# Patient Record
Sex: Female | Born: 2000 | Race: Black or African American | Hispanic: No | Marital: Single
Health system: Northeastern US, Academic
[De-identification: ages and names within clinical notes are randomized; demographics above are authoritative.]

## PROBLEM LIST (undated history)

## (undated) DIAGNOSIS — R4589 Other symptoms and signs involving emotional state: Secondary | ICD-10-CM

## (undated) DIAGNOSIS — F332 Major depressive disorder, recurrent severe without psychotic features: Secondary | ICD-10-CM

## (undated) DIAGNOSIS — R45851 Suicidal ideations: Secondary | ICD-10-CM

## (undated) DIAGNOSIS — G4700 Insomnia, unspecified: Secondary | ICD-10-CM

## (undated) DIAGNOSIS — F938 Other childhood emotional disorders: Secondary | ICD-10-CM

---
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
## 2018-01-02 LAB — URINE CULTURE: Culture: 60000 — AB

## 2018-01-02 LAB — GC/CHLAMYDIA PROBE AMP (~~LOC~~) NOT AT ARMC
CHLAMYDIA, DNA PROBE: NEGATIVE
NEISSERIA GONORRHEA: NEGATIVE

## 2018-04-12 ENCOUNTER — Encounter (HOSPITAL_COMMUNITY): Payer: Self-pay | Admitting: Emergency Medicine

## 2018-04-12 ENCOUNTER — Emergency Department (HOSPITAL_COMMUNITY)
Admission: EM | Admit: 2018-04-12 | Discharge: 2018-04-12 | Disposition: A | Discharge status: Home / Self Care | Payer: Managed Care, Other (non HMO) | Attending: Emergency Medicine | Admitting: Emergency Medicine

## 2018-04-12 DIAGNOSIS — Z5321 Procedure and treatment not carried out due to patient leaving prior to being seen by health care provider: Secondary | ICD-10-CM | POA: Diagnosis not present

## 2018-04-12 DIAGNOSIS — N898 Other specified noninflammatory disorders of vagina: Secondary | ICD-10-CM | POA: Diagnosis not present

## 2018-04-12 LAB — URINALYSIS, ROUTINE W REFLEX MICROSCOPIC
BILIRUBIN URINE: NEGATIVE
Bacteria, UA: NONE SEEN
GLUCOSE, UA: NEGATIVE mg/dL
KETONES UR: NEGATIVE mg/dL
Nitrite: NEGATIVE
PH: 5 (ref 5.0–8.0)
PROTEIN: NEGATIVE mg/dL
Specific Gravity, Urine: 1.029 (ref 1.005–1.030)

## 2018-04-12 LAB — PREGNANCY, URINE: Preg Test, Ur: NEGATIVE

## 2018-04-12 NOTE — ED Triage Notes (Signed)
Pt reports vaginal discomfort and vaginal discharge on/off X1 mo. Was seen at clinic and dx with yeast infection, STD test came back negative but continues to have pain and discomfort.

## 2018-04-12 NOTE — ED Notes (Signed)
Pt reports she is leaving and going to [facility] morning.

## 2018-04-14 LAB — URINE CULTURE

## 2018-08-29 ENCOUNTER — Other Ambulatory Visit: Payer: Self-pay | Admitting: Behavioral Health

## 2018-08-29 ENCOUNTER — Other Ambulatory Visit: Payer: Self-pay

## 2018-08-29 ENCOUNTER — Inpatient Hospital Stay (HOSPITAL_COMMUNITY): Admit: 2018-08-29 | Payer: Managed Care, Other (non HMO) | Admitting: Psychiatry

## 2018-08-29 ENCOUNTER — Inpatient Hospital Stay (HOSPITAL_COMMUNITY)
Admission: RE | Admit: 2018-08-29 | Discharge: 2018-09-01 | DRG: 881 | Disposition: A | Discharge status: Home / Self Care | Payer: Managed Care, Other (non HMO) | Attending: Psychiatry | Admitting: Psychiatry

## 2018-08-29 ENCOUNTER — Encounter (HOSPITAL_COMMUNITY): Payer: Self-pay

## 2018-08-29 DIAGNOSIS — F329 Major depressive disorder, single episode, unspecified: Secondary | ICD-10-CM | POA: Diagnosis present

## 2018-08-29 DIAGNOSIS — F333 Major depressive disorder, recurrent, severe with psychotic symptoms: Secondary | ICD-10-CM | POA: Diagnosis not present

## 2018-08-29 DIAGNOSIS — R45851 Suicidal ideations: Secondary | ICD-10-CM | POA: Diagnosis present

## 2018-08-29 DIAGNOSIS — Z1159 Encounter for screening for other viral diseases: Secondary | ICD-10-CM

## 2018-08-29 DIAGNOSIS — G4700 Insomnia, unspecified: Secondary | ICD-10-CM | POA: Diagnosis present

## 2018-08-29 LAB — SARS CORONAVIRUS 2 BY RT PCR (HOSPITAL ORDER, PERFORMED IN ~~LOC~~ HOSPITAL LAB): SARS Coronavirus 2: NEGATIVE

## 2018-08-29 MED ORDER — ACETAMINOPHEN 325 MG PO TABS
650.0000 mg | ORAL_TABLET | Freq: Four times a day (QID) | ORAL | Status: DC | PRN
  Administered 2018-09-01: 650 mg via ORAL
  Filled 2018-08-29: qty 2

## 2018-08-29 MED ORDER — ALUM & MAG HYDROXIDE-SIMETH 200-200-20 MG/5ML PO SUSP: 30.0000 mL | ORAL | Status: DC | PRN

## 2018-08-29 MED ORDER — HYDROXYZINE HCL 25 MG PO TABS
25.0000 mg | ORAL_TABLET | Freq: Three times a day (TID) | ORAL | Status: DC | PRN
  Administered 2018-08-30: 25 mg via ORAL
  Filled 2018-08-29 (×2): qty 1

## 2018-08-29 MED ORDER — TRAZODONE HCL 50 MG PO TABS
50.0000 mg | ORAL_TABLET | Freq: Every evening | ORAL | Status: DC | PRN
  Administered 2018-08-29 – 2018-08-31 (×3): 50 mg via ORAL
  Filled 2018-08-29 (×12): qty 1
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
## 2018-08-29 NOTE — Progress Notes (Signed)
Patient rated her day as a 6 out of a possible 10. The patient stated in group that she was not happy about being in the hospital. She also explained that she is dealing with a general lack of support. Her goal for tomorrow is to work on "stress management".

## 2018-08-29 NOTE — Progress Notes (Signed)
Covid swab collected per order 08/29/2018 at 1515.

## 2018-08-29 NOTE — Progress Notes (Signed)
Skin Assessment:  Patient has one tattoo on L wrist.  No skin problems.
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
## 2018-08-30 DIAGNOSIS — F333 Major depressive disorder, recurrent, severe with psychotic symptoms: Secondary | ICD-10-CM

## 2018-08-30 LAB — COMPREHENSIVE METABOLIC PANEL
ALT: 13 U/L (ref 0–44)
AST: 16 U/L (ref 15–41)
Albumin: 4.3 g/dL (ref 3.5–5.0)
Alkaline Phosphatase: 79 U/L (ref 38–126)
Anion gap: 7 (ref 5–15)
BUN: 9 mg/dL (ref 6–20)
CO2: 26 mmol/L (ref 22–32)
Calcium: 9.3 mg/dL (ref 8.9–10.3)
Chloride: 106 mmol/L (ref 98–111)
Creatinine, Ser: 0.73 mg/dL (ref 0.44–1.00)
GFR calc Af Amer: >60 mL/min (ref >60)
GFR calc non Af Amer: >60 mL/min (ref >60)
Glucose, Bld: 91 mg/dL (ref 70–99)
Potassium: 3.8 mmol/L (ref 3.5–5.1)
Sodium: 139 mmol/L (ref 135–145)
Total Bilirubin: 0.2 mg/dL — ABNORMAL LOW (ref 0.3–1.2)
Total Protein: 7 g/dL (ref 6.5–8.1)

## 2018-08-30 LAB — HEMOGLOBIN A1C
Hgb A1c MFr Bld: 5.4 % (ref 4.8–5.6)
Mean Plasma Glucose: 108.28 mg/dL

## 2018-08-30 LAB — LIPID PANEL
Cholesterol: 96 mg/dL (ref 0–200)
HDL: 58 mg/dL (ref >40)
LDL Cholesterol: 30 mg/dL (ref 0–99)
Total CHOL/HDL Ratio: 1.7 RATIO
Triglycerides: 42 mg/dL (ref <150)
VLDL: 8 mg/dL (ref 0–40)

## 2018-08-30 LAB — CBC
HCT: 38.3 % (ref 36.0–46.0)
Hemoglobin: 12.6 g/dL (ref 12.0–15.0)
MCH: 30.1 pg (ref 26.0–34.0)
MCHC: 32.9 g/dL (ref 30.0–36.0)
MCV: 91.6 fL (ref 80.0–100.0)
Platelets: 254 10*3/uL (ref 150–400)
RBC: 4.18 MIL/uL (ref 3.87–5.11)
RDW: 13.2 % (ref 11.5–15.5)
WBC: 5.7 10*3/uL (ref 4.0–10.5)
nRBC: 0 % (ref 0.0–0.2)

## 2018-08-30 LAB — ETHANOL: Alcohol, Ethyl (B): <10 mg/dL (ref <10)

## 2018-08-30 LAB — TSH: TSH: 2.748 u[IU]/mL (ref 0.350–4.500)

## 2018-08-30 MED ORDER — SERTRALINE HCL 25 MG PO TABS
25.0000 mg | ORAL_TABLET | Freq: Every day | ORAL | Status: DC
  Administered 2018-08-30 – 2018-09-01 (×3): 25 mg via ORAL
  Filled 2018-08-30 (×6): qty 1

## 2018-08-30 MED ORDER — ADULT MULTIVITAMIN W/MINERALS CH
1.0000 | ORAL_TABLET | Freq: Every day | ORAL | Status: DC
  Administered 2018-08-30 – 2018-09-01 (×3): 1 via ORAL
  Filled 2018-08-30 (×6): qty 1
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
## 2018-08-30 NOTE — Progress Notes (Signed)
Recreation Therapy Notes  Date:  7.1.20 Time: 0930 Location: [address] Dayroom  Group Topic: Stress Management  Goal Area(s) Addresses:  Patient will identify positive stress management techniques. Patient will identify benefits of using stress management post d/c.  Behavioral Response: Engaged  Intervention:  Stress Management  Activity :  Guided Imagery.  LRT introduced the stress management technique of guided imagery.  LRT read a script that lead patients on a journey on the beach.  Patients were to listen and follow along as LRT read script.  Education:  Stress Management, Discharge Planning.   Education Outcome: Acknowledges Education  Clinical Observations/Feedback:  Pt attended and participated in group.    Victorino Sparrow, LRT/CTRS         Ria Comment, Bernd Crom A 08/30/2018 11:10 AM

## 2018-08-30 NOTE — Progress Notes (Addendum)
Patient reports that her day was ok and she denies any thoughts of hurting herself or others.  She is calm and cooperative and has no complaints throughout the evening.  Sleep medication requested and order was obtained for Trazodone.  Medications administered as ordered.  Safety checks q 15 minutes. Emotional support provided.  Safety maintained on unit.    [place] NOVEL CORONAVIRUS (COVID-19) DAILY CHECK-OFF SYMPTOMS - answer yes or no to each - every day NO YES  Have you had a fever in the past 24 hours?  . Fever (Temp > 37.80C / 100F) X   Have you had any of these symptoms in the past 24 hours? . New Cough .  Sore Throat  .  Shortness of Breath .  Difficulty Breathing .  Unexplained Body Aches   X   Have you had any one of these symptoms in the past 24 hours not related to allergies?   . Runny Nose .  Nasal Congestion .  Sneezing   X   If you have had runny nose, nasal congestion, sneezing in the past 24 hours, has it worsened?  X   EXPOSURES - check yes or no X   Have you traveled outside the state in the past 14 days?  X   Have you been in contact with someone with a confirmed diagnosis of COVID-19 or PUI in the past 14 days without wearing appropriate PPE?  X   Have you been living in the same home as a person with confirmed diagnosis of COVID-19 or a PUI (household contact)?    X   Have you been diagnosed with COVID-19?    X              What to do next: Answered NO to all: Answered YES to anything:   Proceed with unit schedule Follow the [facility] Inpatient Flowsheet.

## 2018-08-30 NOTE — H&P (Signed)
Psychiatric Admission Assessment Adult  Patient Identification: Donna Velez MRN:  [id number] Date of Evaluation:  08/30/2018 Chief Complaint:  SI Principal Diagnosis: <principal problem not specified> Diagnosis:  Active Problems:   MDD (major depressive disorder)  History of Present Illness: Patient is seen and examined.  Patient is a 18 year old female with a probable past psychiatric history significant for major depression as well as posttraumatic stress disorder who presented to the behavioral health hospital as a walk-in on 08/29/2018.  She had been meeting with her counselor on the day of admission, and had disclosed that she was having suicidal thoughts.  The patient endorsed thoughts with a plan to overdose.  She also has cutting behaviors, and this seems to have increased recently.  She stated that she had had increased psychosocial stressors of just trying to cope with life as an independent adult.  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  She also stated that she is lost 30 pounds recently secondary to a poor appetite, and admitted to nightmares and flashbacks from her previous trauma.  She had a previous admission as an adolescent to the child psychiatry ward 1 to 2 years ago.  She had been placed on Lamictal as well as Lexapro at that time.  She stopped the Lamictal because she felt as though it numbed her emotions, but did continue on the Lexapro.  She admitted to also feeling quite anxious.  She was admitted to the hospital for evaluation and stabilization.  Associated Signs/Symptoms: Depression Symptoms:  depressed mood, anhedonia, insomnia, psychomotor retardation, fatigue, feelings of worthlessness/guilt, difficulty concentrating, hopelessness, suicidal thoughts without plan, anxiety, loss of energy/fatigue, disturbed sleep, weight loss, (Hypo) Manic  Symptoms:  Impulsivity, Irritable Mood, Labiality of Mood, Sexually Inapproprite Behavior, Anxiety Symptoms:  Excessive Worry, Psychotic Symptoms:  denied PTSD Symptoms: Had a traumatic exposure:  in the past Total Time spent with patient: 30 minutes  Past Psychiatric History: one previous hospitalization as an adolescent in 2018. % previous suicide attempts according to EMR  Is the patient at risk to self? Yes.    Has the patient been a risk to self in the past 6 months? Yes.    Has the patient been a risk to self within the distant past? Yes.    Is the patient a risk to others? No.  Has the patient been a risk to others in the past 6 months? No.  Has the patient been a risk to others within the distant past? No.   Prior Inpatient Therapy: Prior Inpatient Therapy: Yes Prior Therapy Dates: 09/2016 Prior Therapy Facilty/Provider(s): [facility] Reason for Treatment: Depression, SI Prior Outpatient Therapy: Prior Outpatient Therapy: Yes Prior Therapy Dates: ongoing Prior Therapy Facilty/Provider(s): [name] Reason for Treatment: Depression,stress Does patient have an ACCT team?: No Does patient have Intensive In-House Services?  : No Does patient have [facility] services? : No Does patient have P4CC services?: No  Alcohol Screening: 1. How often do you have a drink containing alcohol?: Monthly or less 2. How many drinks containing alcohol do you have on a typical day when you are drinking?: 1 or 2 3. How often do you have six or more drinks on one occasion?: Never AUDIT-C Score: 1 4. How often during the last year have you found that you were not able to stop drinking once you had started?: Never 5.  How often during the last year have you failed to do what was normally expected from you becasue of drinking?: Never 6. How often during the last year have you needed a first drink in the morning to get yourself going after a heavy drinking session?: Never 7. How often  during the last year have you had a feeling of guilt of remorse after drinking?: Never 8. How often during the last year have you been unable to remember what happened the night before because you had been drinking?: Never 9. Have you or someone else been injured as a result of your drinking?: No 10. Has a relative or friend or a doctor or another health worker been concerned about your drinking or suggested you cut down?: No Alcohol Use Disorder Identification Test Final Score (AUDIT): 1 Substance Abuse History in the last 12 months:  Yes.   Consequences of Substance Abuse: Negative Previous Psychotropic Medications: Yes  Psychological Evaluations: Yes  Past Medical History:  Past Medical History:  Diagnosis Date  . 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 At risk for self harm 10/19/2016  . Insomnia 10/19/2016  . MDD (major depressive disorder), recurrent episode, severe (HCC) 10/19/2016  . Suicidal ideation 10/19/2016   History reviewed. No pertinent surgical history. Family History: History reviewed. No pertinent family history. Family Psychiatric  History: Patient is adopted but stated a history of addiction and alcohol use on both sides of biological family Tobacco Screening:   Social History:  Social History   Substance and Sexual Activity  Alcohol Use No     Social History   Substance and Sexual Activity  Drug Use Yes  . Types: Marijuana    Additional Social History: Marital status: Single    Pain Medications: none reported Prescriptions: stopped meds Over the Counter: unknown History of alcohol / drug use?: Yes Name of Substance 1: THC 1 - Frequency: couple times daily                  Allergies:   Allergies  Allergen Reactions  . Amoxicillin Hives         Lab Results:  Results for orders placed or performed during the hospital encounter of 08/29/18 (from the past 48 hour(s))  SARS Coronavirus 2 (CEPHEID - Performed in [facility] hospital lab),  Hosp Order     Status: None   Collection Time: 08/29/18  2:51 PM   Specimen: Nasopharyngeal Swab  Result Value Ref Range   SARS Coronavirus 2 NEGATIVE NEGATIVE    Comment: (NOTE) If result is NEGATIVE SARS-CoV-2 target nucleic acids are NOT DETECTED. The SARS-CoV-2 RNA is generally detectable in upper and lower  respiratory specimens during the acute phase of infection. The lowest  concentration of SARS-CoV-2 viral copies this assay can detect is 250  copies / mL. A negative result does not preclude SARS-CoV-2 infection  and should not be used as the sole basis for treatment or other  patient management decisions.  A negative result may occur with  improper specimen collection / handling, submission of specimen other  than nasopharyngeal swab, presence of viral mutation(s) within the  areas targeted by this assay, and inadequate number of viral copies  (<250 copies / mL). A negative result must be combined with clinical  observations, patient history, and epidemiological information. If result is POSITIVE SARS-CoV-2 target nucleic acids are DETECTED. The SARS-CoV-2 RNA is generally detectable in upper and lower  respiratory specimens dur ing the acute phase of infection.  Positive  results are indicative of active infection with SARS-CoV-2.  Clinical  correlation with patient history and other diagnostic information is  necessary to determine patient infection status.  Positive results do  not rule out bacterial infection or co-infection with other viruses. If result is PRESUMPTIVE POSTIVE SARS-CoV-2 nucleic acids MAY BE PRESENT.   A presumptive positive result was obtained on the submitted specimen  and confirmed on repeat testing.  While 2019 novel coronavirus  (SARS-CoV-2) nucleic acids may be present in the submitted sample  additional confirmatory testing may be necessary for epidemiological  and / or clinical management purposes  to differentiate between  SARS-CoV-2 and other  Sarbecovirus currently known to infect humans.  If clinically indicated additional testing with an alternate test  methodology [id number]) is advised. The SARS-CoV-2 RNA is generally  detectable in upper and lower respiratory sp ecimens during the acute  phase of infection. The expected result is Negative. Fact Sheet for Patients:  StrictlyIdeas.no Fact Sheet for Healthcare Providers: BankingDealers.co.za This test is not yet approved or cleared by the [place] FDA and has been authorized for detection and/or diagnosis of SARS-CoV-2 by FDA under an Emergency Use Authorization (EUA).  This EUA will remain in effect (meaning this test can be used) for the duration of the COVID-19 declaration under Section 564(b)(1) of the Act, 21 U.S.C. section 360bbb-3(b)(1), unless the authorization is terminated or revoked sooner. Performed at [facility], [place] [address], [place], [place] [address]   CBC     Status: None   Collection Time: 08/30/18  6:45 AM  Result Value Ref Range   WBC 5.7 4.0 - 10.5 K/uL   RBC 4.18 3.87 - 5.11 MIL/uL   Hemoglobin 12.6 12.0 - 15.0 g/dL   HCT 38.3 36.0 - 46.0 %   MCV 91.6 80.0 - 100.0 fL   MCH 30.1 26.0 - 34.0 pg   MCHC 32.9 30.0 - 36.0 g/dL   RDW 13.2 11.5 - 15.5 %   Platelets 254 150 - 400 K/uL   nRBC 0.0 0.0 - 0.2 %    Comment: Performed at [facility], [place] [address], [place], [place] [address]  Comprehensive metabolic panel     Status: Abnormal   Collection Time: 08/30/18  6:45 AM  Result Value Ref Range   Sodium 139 135 - 145 mmol/L   Potassium 3.8 3.5 - 5.1 mmol/L   Chloride 106 98 - 111 mmol/L   CO2 26 22 - 32 mmol/L   Glucose, Bld 91 70 - 99 mg/dL   BUN 9 6 - 20 mg/dL   Creatinine, Ser 0.73 0.44 - 1.00 mg/dL   Calcium 9.3 8.9 - 10.3 mg/dL   Total Protein 7.0 6.5 - 8.1 g/dL   Albumin 4.3 3.5 - 5.0 g/dL   AST 16 15 - 41 U/L   ALT 13 0 - 44 U/L   Alkaline  Phosphatase 79 38 - 126 U/L   Total Bilirubin 0.2 (L) 0.3 - 1.2 mg/dL   GFR calc non Af Amer >60 >60 mL/min   GFR calc Af Amer >60 >60 mL/min   Anion gap 7 5 - 15    Comment: Performed at [facility], [place] [address] [place], [place] [address]  Ethanol     Status: None   Collection Time: 08/30/18  6:45 AM  Result Value Ref Range   Alcohol, Ethyl (B) <10 <10 mg/dL    Comment: (NOTE) Lowest detectable limit for serum alcohol is 10  mg/dL. For medical purposes only. Performed at [facility], [address] [place], [place] [address]   Hemoglobin A1c     Status: None   Collection Time: 08/30/18  6:45 AM  Result Value Ref Range   Hgb A1c MFr Bld 5.4 4.8 - 5.6 %    Comment: (NOTE) Pre diabetes:          5.7%-6.4% Diabetes:              >6.4% Glycemic control for   <7.0% adults with diabetes    Mean Plasma Glucose 108.28 mg/dL    Comment: Performed at [facility], [address] [place], [place] [address]  Lipid panel     Status: None   Collection Time: 08/30/18  6:45 AM  Result Value Ref Range   Cholesterol 96 0 - 200 mg/dL   Triglycerides 42 <865<150 mg/dL   HDL 58 >78>40 mg/dL   Total CHOL/HDL Ratio 1.7 RATIO   VLDL 8 0 - 40 mg/dL   LDL Cholesterol 30 0 - 99 mg/dL    Comment:        Total Cholesterol/HDL:CHD Risk Coronary Heart Disease Risk Table                     Men   Women  1/2 Average Risk   3.4   3.3  Average Risk       5.0   4.4  2 X Average Risk   9.6   7.1  3 X Average Risk  23.4   11.0        Use the calculated Patient Ratio above and the CHD Risk Table to determine the patient's CHD Risk.        ATP III CLASSIFICATION (LDL):  <100     mg/dL   Optimal  [id number]  mg/dL   Near or Above                    Optimal  130-159  mg/dL   Borderline  [id number]  mg/dL   High  >244>190     mg/dL   Very High Performed at [facility], [address] [place], [place] [address]   TSH     Status: None   Collection  Time: 08/30/18  6:45 AM  Result Value Ref Range   TSH 2.748 0.350 - 4.500 uIU/mL    Comment: Performed by a 3rd Generation assay with a functional sensitivity of <=0.01 uIU/mL. Performed at [facility], [address] [place], [place] [address]     Blood Alcohol level:  Lab Results  Component Value Date   ETH <10 08/30/2018    Metabolic Disorder Labs:  Lab Results  Component Value Date   HGBA1C 5.4 08/30/2018   MPG 108.28 08/30/2018   MPG 111.15 10/18/2016   No results found for: PROLACTIN Lab Results  Component Value Date   CHOL 96 08/30/2018   TRIG 42 08/30/2018   HDL 58 08/30/2018   CHOLHDL 1.7 08/30/2018   VLDL 8 08/30/2018   LDLCALC 30 08/30/2018    Current Medications: Current Facility-Administered Medications  Medication Dose Route Frequency Provider Last Rate Last Dose  . acetaminophen (TYLENOL) tablet 650 mg  650 mg Oral Q6H PRN Denzil Magnusonhomas, Lashunda, NP      . alum & mag hydroxide-simeth (MAALOX/MYLANTA) 200-200-20 MG/5ML suspension 30 mL  30 mL Oral Q4H PRN Denzil Magnusonhomas, Lashunda, NP      . hydrOXYzine (  ATARAX/VISTARIL) tablet 25 mg  25 mg Oral TID PRN Nira Conn A, NP      . multivitamin with minerals tablet 1 tablet  1 tablet Oral Daily Cobos, Rockey Situ, MD   1 tablet at 08/30/18 0927  . sertraline (ZOLOFT) tablet 25 mg  25 mg Oral Daily Antonieta Pert, MD   25 mg at 08/30/18 1032  . traZODone (DESYREL) tablet 50 mg  50 mg Oral QHS,MR X 1 Nira Conn A, NP   50 mg at 08/29/18 2200   PTA Medications: No medications prior to admission.    Musculoskeletal: Strength & Muscle Tone: within normal limits Gait & Station: normal Patient leans: N/A  Psychiatric Specialty Exam: Physical Exam  Nursing note and vitals reviewed. Constitutional: She is oriented to person, place, and time. She appears well-developed and well-nourished.  HENT:  Head: Normocephalic and atraumatic.  Respiratory: Effort normal.  Neurological: She is alert and  oriented to person, place, and time.    ROS  Blood pressure 120/74, pulse 61, temperature 98.4 F (36.9 C), temperature source Oral, resp. rate 16, height  (1.702 m), weight 68.9 kg, SpO2 100 %.Body mass index is 23.81 kg/m.  General Appearance: Casual  Eye Contact:  Fair  Speech:  Normal Rate  Volume:  Decreased  Mood:  Anxious and Depressed  Affect:  Congruent  Thought Process:  Coherent and Descriptions of Associations: Intact  Orientation:  Full (Time, Place, and Person)  Thought Content:  Logical  Suicidal Thoughts:  Yes.  without intent/plan  Homicidal Thoughts:  No  Memory:  Immediate;   Fair Recent;   Fair Remote;   Fair  Judgement:  Intact  Insight:  Fair  Psychomotor Activity:  Increased  Concentration:  Concentration: Fair and Attention Span: Fair  Recall:  [employer] of Knowledge:  Fair  Language:  Good  Akathisia:  Negative  Handed:  Right  AIMS (if indicated):     Assets:  Desire for Improvement Resilience  ADL's:  Intact  Cognition:  WNL  Sleep:       Treatment Plan Summary: Daily contact with patient to assess and evaluate symptoms and progress in treatment, Medication management and Plan : Patient is seen and examined.  Patient is a 18 year old female with a probable past psychiatric history significant for major depression as well as posttraumatic stress disorder.  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  She will be admitted to the hospital.  She will be integrated into the milieu.  She will be encouraged to attend groups.  She had previously been on Lamictal as well as Lexapro.  She was not really happy with the results.  These have been stopped in the past.  I am going to start her on Zoloft 25 mg p.o. daily and titrate that during the course the hospitalization.  Additionally we may want to add Catapres 1 mg p.o. nightly for nightmares and flashbacks, but she preferred to hold off on that initially.  She did endorse some  hypersexuality, but no euphoria, excessive spending or other symptoms consistent with bipolar disorder.  We will monitor for that.  She did admit to marijuana use, as well as some hallucinogens.  Review of her laboratories showed essentially normal chemistries, normal CBC, but no urine drug screen.  That is pending currently.  Hopefully we can get her headed in the right direction.  Observation Level/Precautions:  15 minute checks  Laboratory:  Chemistry Profile  Psychotherapy:    Medications:  Consultations:    Discharge Concerns:    Estimated LOS:  Other:     Physician Treatment Plan for Primary Diagnosis: <principal problem not specified> Long Term Goal(s): Improvement in symptoms so as ready for discharge  Short Term Goals: Ability to identify changes in lifestyle to reduce recurrence of condition will improve, Ability to verbalize feelings will improve, Ability to disclose and discuss suicidal ideas, Ability to demonstrate self-control will improve, Ability to identify and develop effective coping behaviors will improve, Ability to maintain clinical measurements within normal limits will improve and Compliance with prescribed medications will improve  Physician Treatment Plan for Secondary Diagnosis: Active Problems:   MDD (major depressive disorder)  Long Term Goal(s): Improvement in symptoms so as ready for discharge  Short Term Goals: Ability to identify changes in lifestyle to reduce recurrence of condition will improve, Ability to verbalize feelings will improve, Ability to disclose and discuss suicidal ideas, Ability to demonstrate self-control will improve, Ability to identify and develop effective coping behaviors will improve, Ability to maintain clinical measurements within normal limits will improve and Compliance with prescribed medications will improve  I certify that inpatient services furnished can reasonably be expected to improve the patient's condition.    Antonieta PertGreg  Lawson Clary, MD 7/1/202012:59 PM

## 2018-08-30 NOTE — Progress Notes (Signed)
D:  Patient's self inventory sheet, patient has poor sleep, sleep medication helpful.  Poor appetite, low energy level, poor concentration.  Rated depression 8, hopeless 6, denied anxiety.  Denied withdrawals.  SI, contracts for safety.  Denied physical problems.  Physical pain, worst pain in past 24 hours is #5, abdomen, back.  Goal is being patient.  Plans to take medications.  No discharge plans. A:  Medications administered per MD orders.  Emotional support and encouragement given patient. R:  Denied SI and HI while talking to nurse today, contracts for safety.  Denied A/V hallucinations.  Safety maintained with 15 minute checks.

## 2018-08-30 NOTE — Progress Notes (Signed)
NUTRITION ASSESSMENT RD working remotely.   Pt identified as at risk on the Malnutrition Screen Tool  INTERVENTION: -will order daily multivitamin with minerals. -continue to encourage PO intakes.   NUTRITION DIAGNOSIS: Unintentional weight loss related to sub-optimal intake as evidenced by pt report.   Goal: Pt to meet >/= 90% of their estimated nutrition needs.  Monitor:  PO intake  Assessment:  Patient admitted for worsening depression, anxiety, worry, and attempted OD with ibuprofen. Limited weight history available. Current weight is 152 lb and weight on 12/17/16 was 161 lb and on 10/18/16 it was 152 lb.     18 y.o. female  Height: Ht Readings from Last 1 Encounters:  08/29/18 5\' 7"  (1.702 m) (86 %, Z= 1.06)*   * Growth percentiles are based on CDC (Girls, 2-20 Years) data.    Weight: Wt Readings from Last 1 Encounters:  08/29/18 68.9 kg (82 %, Z= 0.91)*   * Growth percentiles are based on CDC (Girls, 2-20 Years) data.    Weight Hx: Wt Readings from Last 10 Encounters:  08/29/18 68.9 kg (82 %, Z= 0.91)*  12/17/16 73.3 kg (90 %, Z= 1.30)*  10/18/16 68.8 kg (86 %, Z= 1.06)*   * Growth percentiles are based on CDC (Girls, 2-20 Years) data.    BMI:  Body mass index is 23.81 kg/m. Pt meets criteria for normal weight based on current BMI.  Estimated Nutritional Needs: Kcal: 25-30 kcal/kg Protein: > 1 gram protein/kg Fluid: 1 ml/kcal  Diet Order:  Diet Order            Diet regular Room service appropriate? No; Fluid consistency: Thin; Fluid restriction: 2000 mL Fluid  Diet effective now             Pt is also offered choice of unit snacks mid-morning and mid-afternoon.  Pt is eating as desired.   Lab results and medications reviewed.      Jarome Matin, MS, RD, LDN, [facility] Inpatient Clinical Dietitian Pager # [phone] After hours/weekend pager # [phone]

## 2018-08-31 LAB — RAPID URINE DRUG SCREEN, HOSP PERFORMED
Amphetamines: NOT DETECTED
Barbiturates: NOT DETECTED
Benzodiazepines: NOT DETECTED
Cocaine: NOT DETECTED
Opiates: NOT DETECTED
Tetrahydrocannabinol: POSITIVE — AB

## 2018-08-31 LAB — PREGNANCY, URINE: Preg Test, Ur: NEGATIVE

## 2018-08-31 MED ORDER — POLYETHYLENE GLYCOL 3350 17 G PO PACK
17.0000 g | PACK | Freq: Every day | ORAL | Status: DC
  Administered 2018-08-31 – 2018-09-01 (×2): 17 g via ORAL
  Filled 2018-08-31 (×4): qty 1

## 2018-08-31 MED ORDER — ENSURE ENLIVE PO LIQD
237.0000 mL | Freq: Two times a day (BID) | ORAL | Status: DC
  Administered 2018-08-31 – 2018-09-01 (×3): 237 mL via ORAL

## 2018-08-31 MED ORDER — DOCUSATE SODIUM 100 MG PO CAPS: 100.0000 mg | ORAL_CAPSULE | Freq: Every day | ORAL | Status: DC | PRN

## 2018-08-31 NOTE — Progress Notes (Signed)
DAR NOTE: Patient presents with anxious affect and irritable mood.  Denies suicidal thoughts, auditory and visual hallucinations.  Described energy level as low and concentration as good.  Patient observed yelling while using the phone.  Rates depression at 7, hopelessness at 6, and anxiety at 0.  Maintained on routine safety checks.  Medications given as prescribed.  Support and encouragement offered as needed.  Attended group and participated.  States goal for today is "not blowing up."  Patient remained in her room for majority of this shift.  Patient is safe on and off the unit.

## 2018-08-31 NOTE — Progress Notes (Signed)
[facility] MD Progress Note  08/31/2018 9:23 AM Donna Velez  MRN:  [id number] Subjective:  Patient is a 18 year old female with a probable past psychiatric history significant for major depression as well as posttraumatic stress disorder who presented to the behavioral health hospital as a walk-in on 08/29/2018. She had been meeting with her counselor on the day of admission, and had disclosed that she was having suicidal thoughts. The patient endorsed thoughts with a plan to overdose.  Objective: Patient is seen and examined.  Patient is a 18 year old female with the above-stated past psychiatric history who is seen in follow-up.  She stated she is feeling a bit better today.  She stated she got anxious last night, and talk with staff and was able to calm down.  She stated she has had decreases in her suicidal ideation.  She thought she has had a mild degree of progress.  We did discuss adding an Ensure to her diet given her weight loss, and also she is having some difficulties with constipation.  She denied any side effects to her current medications.  Review of her laboratories from 7/1 were all essentially normal except for the marijuana being present.  Her vital signs are stable, she is afebrile.  Her CIWA was only 1 this a.m.  She slept 6.75 hours last night.  Principal Problem: <principal problem not specified> Diagnosis: Active Problems:   MDD (major depressive disorder)  Total Time spent with patient: 15 minutes  Past Psychiatric History: See admission H&P  Past Medical History:  Past Medical History:  Diagnosis Date  . 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 At risk for self harm 10/19/2016  . Insomnia 10/19/2016  . MDD (major depressive disorder), recurrent episode, severe (HCC) 10/19/2016  . Suicidal ideation 10/19/2016   History reviewed. No pertinent surgical history. Family History: History reviewed. No pertinent family history. Family Psychiatric  History: See admission H&P Social  History:  Social History   Substance and Sexual Activity  Alcohol Use No     Social History   Substance and Sexual Activity  Drug Use Yes  . Types: Marijuana    Social History   Socioeconomic History  . Marital status: Single    Spouse name: Not on file  . Number of children: Not on file  . Years of education: Not on file  . Highest education level: Not on file  Occupational History  . Not on file  Social Needs  . Financial resource strain: Not on file  . Food insecurity    Worry: Not on file    Inability: Not on file  . Transportation needs    Medical: Not on file    Non-medical: Not on file  Tobacco Use  . Smoking status: Never Smoker  . Smokeless tobacco: Never Used  Substance and Sexual Activity  . Alcohol use: No  . Drug use: Yes    Types: Marijuana  . Sexual activity: Yes    Birth control/protection: Condom, I.U.D.  Lifestyle  . Physical activity    Days per week: Not on file    Minutes per session: Not on file  . Stress: Not on file  Relationships  . Social Musician on phone: Not on file    Gets together: Not on file    Attends religious service: Not on file    Active member of club or organization: Not on file    Attends meetings of clubs or organizations: Not on file  Relationship status: Not on file  Other Topics Concern  . Not on file  Social History Narrative  . Not on file   Additional Social History:    Pain Medications: none reported Prescriptions: stopped meds Over the Counter: unknown History of alcohol / drug use?: Yes Name of Substance 1: THC 1 - Frequency: couple times daily                  Sleep: Good  Appetite:  Fair  Current Medications: Current Facility-Administered Medications  Medication Dose Route Frequency Provider Last Rate Last Dose  . acetaminophen (TYLENOL) tablet 650 mg  650 mg Oral Q6H PRN Denzil Magnusonhomas, Lashunda, NP      . alum & mag hydroxide-simeth (MAALOX/MYLANTA) 200-200-20 MG/5ML  suspension 30 mL  30 mL Oral Q4H PRN Denzil Magnusonhomas, Lashunda, NP      . docusate sodium (COLACE) capsule 100 mg  100 mg Oral Daily PRN Antonieta Pertlary, Greg Lawson, MD      . feeding supplement (ENSURE ENLIVE) (ENSURE ENLIVE) liquid 237 mL  237 mL Oral BID BM Antonieta Pertlary, Greg Lawson, MD      . hydrOXYzine (ATARAX/VISTARIL) tablet 25 mg  25 mg Oral TID PRN Jackelyn PolingBerry, Jason A, NP   25 mg at 08/30/18 2212  . multivitamin with minerals tablet 1 tablet  1 tablet Oral Daily Cobos, Rockey SituFernando A, MD   1 tablet at 08/31/18 0816  . polyethylene glycol (MIRALAX / GLYCOLAX) packet 17 g  17 g Oral Daily Antonieta Pertlary, Greg Lawson, MD      . sertraline (ZOLOFT) tablet 25 mg  25 mg Oral Daily Antonieta Pertlary, Greg Lawson, MD   25 mg at 08/31/18 0816  . traZODone (DESYREL) tablet 50 mg  50 mg Oral QHS,MR X 1 Nira ConnBerry, Jason A, NP   50 mg at 08/30/18 2212    Lab Results:  Results for orders placed or performed during the hospital encounter of 08/29/18 (from the past 48 hour(s))  SARS Coronavirus 2 (CEPHEID - Performed in [facility] hospital lab), Hosp Order     Status: None   Collection Time: 08/29/18  2:51 PM   Specimen: Nasopharyngeal Swab  Result Value Ref Range   SARS Coronavirus 2 NEGATIVE NEGATIVE    Comment: (NOTE) If result is NEGATIVE SARS-CoV-2 target nucleic acids are NOT DETECTED. The SARS-CoV-2 RNA is generally detectable in upper and lower  respiratory specimens during the acute phase of infection. The lowest  concentration of SARS-CoV-2 viral copies this assay can detect is 250  copies / mL. A negative result does not preclude SARS-CoV-2 infection  and should not be used as the sole basis for treatment or other  patient management decisions.  A negative result may occur with  improper specimen collection / handling, submission of specimen other  than nasopharyngeal swab, presence of viral mutation(s) within the  areas targeted by this assay, and inadequate number of viral copies  (<250 copies / mL). A negative result must be combined  with clinical  observations, patient history, and epidemiological information. If result is POSITIVE SARS-CoV-2 target nucleic acids are DETECTED. The SARS-CoV-2 RNA is generally detectable in upper and lower  respiratory specimens dur ing the acute phase of infection.  Positive  results are indicative of active infection with SARS-CoV-2.  Clinical  correlation with patient history and other diagnostic information is  necessary to determine patient infection status.  Positive results do  not rule out bacterial infection or co-infection with other viruses. If result is PRESUMPTIVE POSTIVE SARS-CoV-2 nucleic  acids MAY BE PRESENT.   A presumptive positive result was obtained on the submitted specimen  and confirmed on repeat testing.  While 2019 novel coronavirus  (SARS-CoV-2) nucleic acids may be present in the submitted sample  additional confirmatory testing may be necessary for epidemiological  and / or clinical management purposes  to differentiate between  SARS-CoV-2 and other Sarbecovirus currently known to infect humans.  If clinically indicated additional testing with an alternate test  methodology [phone]) is advised. The SARS-CoV-2 RNA is generally  detectable in upper and lower respiratory sp ecimens during the acute  phase of infection. The expected result is Negative. Fact Sheet for Patients:  StrictlyIdeas.no Fact Sheet for Healthcare Providers: BankingDealers.co.za This test is not yet approved or cleared by the [place] FDA and has been authorized for detection and/or diagnosis of SARS-CoV-2 by FDA under an Emergency Use Authorization (EUA).  This EUA will remain in effect (meaning this test can be used) for the duration of the COVID-19 declaration under Section 564(b)(1) of the Act, 21 U.S.C. section 360bbb-3(b)(1), unless the authorization is terminated or revoked sooner. Performed at [facility], [place] [address] [place], [place] [address]   Pregnancy, urine     Status: None   Collection Time: 08/29/18  3:09 PM  Result Value Ref Range   Preg Test, Ur NEGATIVE NEGATIVE    Comment:        THE SENSITIVITY OF THIS METHODOLOGY IS >20 mIU/mL. Performed at [facility], [place] [address], [place], [place] [address]   Urine rapid drug screen (hosp performed)not at [facility]     Status: Abnormal   Collection Time: 08/29/18  3:09 PM  Result Value Ref Range   Opiates NONE DETECTED NONE DETECTED   Cocaine NONE DETECTED NONE DETECTED   Benzodiazepines NONE DETECTED NONE DETECTED   Amphetamines NONE DETECTED NONE DETECTED   Tetrahydrocannabinol POSITIVE (A) NONE DETECTED   Barbiturates NONE DETECTED NONE DETECTED    Comment: (NOTE) DRUG SCREEN FOR MEDICAL PURPOSES ONLY.  IF CONFIRMATION IS NEEDED FOR ANY PURPOSE, NOTIFY LAB WITHIN 5 DAYS. LOWEST DETECTABLE LIMITS FOR URINE DRUG SCREEN Drug Class                     Cutoff (ng/mL) Amphetamine and metabolites    1000 Barbiturate and metabolites    200 Benzodiazepine                 707 Tricyclics and metabolites     300 Opiates and metabolites        300 Cocaine and metabolites        300 THC                            50 Performed at [facility], [place] [address] [place], [place] [address]   CBC     Status: None   Collection Time: 08/30/18  6:45 AM  Result Value Ref Range   WBC 5.7 4.0 - 10.5 K/uL   RBC 4.18 3.87 - 5.11 MIL/uL   Hemoglobin 12.6 12.0 - 15.0 g/dL   HCT 38.3 36.0 - 46.0 %   MCV 91.6 80.0 - 100.0 fL   MCH 30.1 26.0 - 34.0 pg   MCHC 32.9 30.0 - 36.0 g/dL   RDW 13.2 11.5 - 15.5 %   Platelets 254 150 - 400 K/uL   nRBC 0.0 0.0 - 0.2 %  Comment: Performed at [facility], [address] [place], [place] [address]  Comprehensive metabolic panel     Status: Abnormal   Collection Time: 08/30/18  6:45 AM  Result Value Ref Range   Sodium 139 135 - 145  mmol/L   Potassium 3.8 3.5 - 5.1 mmol/L   Chloride 106 98 - 111 mmol/L   CO2 26 22 - 32 mmol/L   Glucose, Bld 91 70 - 99 mg/dL   BUN 9 6 - 20 mg/dL   Creatinine, Ser 6.040.73 0.44 - 1.00 mg/dL   Calcium 9.3 8.9 - 54.010.3 mg/dL   Total Protein 7.0 6.5 - 8.1 g/dL   Albumin 4.3 3.5 - 5.0 g/dL   AST 16 15 - 41 U/L   ALT 13 0 - 44 U/L   Alkaline Phosphatase 79 38 - 126 U/L   Total Bilirubin 0.2 (L) 0.3 - 1.2 mg/dL   GFR calc non Af Amer >60 >60 mL/min   GFR calc Af Amer >60 >60 mL/min   Anion gap 7 5 - 15    Comment: Performed at [facility], [address] [place], [place] [address]  Ethanol     Status: None   Collection Time: 08/30/18  6:45 AM  Result Value Ref Range   Alcohol, Ethyl (B) <10 <10 mg/dL    Comment: (NOTE) Lowest detectable limit for serum alcohol is 10 mg/dL. For medical purposes only. Performed at [facility], [address] [place], [place] [address]   Hemoglobin A1c     Status: None   Collection Time: 08/30/18  6:45 AM  Result Value Ref Range   Hgb A1c MFr Bld 5.4 4.8 - 5.6 %    Comment: (NOTE) Pre diabetes:          5.7%-6.4% Diabetes:              >6.4% Glycemic control for   <7.0% adults with diabetes    Mean Plasma Glucose 108.28 mg/dL    Comment: Performed at [facility], [address] [place], [place] [address]  Lipid panel     Status: None   Collection Time: 08/30/18  6:45 AM  Result Value Ref Range   Cholesterol 96 0 - 200 mg/dL   Triglycerides 42 <308<150 mg/dL   HDL 58 >65>40 mg/dL   Total CHOL/HDL Ratio 1.7 RATIO   VLDL 8 0 - 40 mg/dL   LDL Cholesterol 30 0 - 99 mg/dL    Comment:        Total Cholesterol/HDL:CHD Risk Coronary Heart Disease Risk Table                     Men   Women  1/2 Average Risk   3.4   3.3  Average Risk       5.0   4.4  2 X Average Risk   9.6   7.1  3 X Average Risk  23.4   11.0        Use the calculated Patient Ratio above and the CHD Risk Table to determine the patient's  CHD Risk.        ATP III CLASSIFICATION (LDL):  <100     mg/dL   Optimal  [id number]  mg/dL   Near or Above                    Optimal  130-159  mg/dL   Borderline  [id number]  mg/dL  High  >190     mg/dL   Very High Performed at [facility], [address] [place], [place] [address]   TSH     Status: None   Collection Time: 08/30/18  6:45 AM  Result Value Ref Range   TSH 2.748 0.350 - 4.500 uIU/mL    Comment: Performed by a 3rd Generation assay with a functional sensitivity of <=0.01 uIU/mL. Performed at [facility], [address] [place], [place] [address]     Blood Alcohol level:  Lab Results  Component Value Date   ETH <10 08/30/2018    Metabolic Disorder Labs: Lab Results  Component Value Date   HGBA1C 5.4 08/30/2018   MPG 108.28 08/30/2018   MPG 111.15 10/18/2016   No results found for: PROLACTIN Lab Results  Component Value Date   CHOL 96 08/30/2018   TRIG 42 08/30/2018   HDL 58 08/30/2018   CHOLHDL 1.7 08/30/2018   VLDL 8 08/30/2018   LDLCALC 30 08/30/2018    Physical Findings: AIMS: Facial and Oral Movements Muscles of Facial Expression: None, normal Lips and Perioral Area: None, normal Jaw: None, normal Tongue: None, normal,Extremity Movements Upper (arms, wrists, hands, fingers): None, normal Lower (legs, knees, ankles, toes): None, normal, Trunk Movements Neck, shoulders, hips: None, normal, Overall Severity Severity of abnormal movements (highest score from questions above): None, normal Incapacitation due to abnormal movements: None, normal Patient's awareness of abnormal movements (rate only patient's report): No Awareness, Dental Status Current problems with teeth and/or dentures?: No Does patient usually wear dentures?: No  CIWA:  CIWA-Ar Total: 1 COWS:  COWS Total Score: 1  Musculoskeletal: Strength & Muscle Tone: within normal limits Gait & Station: normal Patient leans: N/A  Psychiatric  Specialty Exam: Physical Exam  Nursing note and vitals reviewed. Constitutional: She is oriented to person, place, and time.  HENT:  Head: Normocephalic and atraumatic.  Respiratory: Effort normal.  Neurological: She is alert and oriented to person, place, and time.    ROS  Blood pressure 120/74, pulse 61, temperature 98.4 F (36.9 C), temperature source Oral, resp. rate 16, height  (1.702 m), weight 68.9 kg, SpO2 100 %.Body mass index is 23.81 kg/m.  General Appearance: Casual  Eye Contact:  Fair  Speech:  Normal Rate  Volume:  Decreased  Mood:  Depressed  Affect:  Congruent  Thought Process:  Coherent and Descriptions of Associations: Intact  Orientation:  Full (Time, Place, and Person)  Thought Content:  Logical  Suicidal Thoughts:  Yes.  without intent/plan  Homicidal Thoughts:  No  Memory:  Immediate;   Fair Recent;   Fair Remote;   Fair  Judgement:  Intact  Insight:  Fair  Psychomotor Activity:  Decreased  Concentration:  Concentration: Fair and Attention Span: Fair  Recall:  [employer] of Knowledge:  Good  Language:  Good  Akathisia:  Negative  Handed:  Right  AIMS (if indicated):     Assets:  Communication Skills Desire for Improvement Housing Resilience  ADL's:  Intact  Cognition:  WNL  Sleep:  Number of Hours: 6.75     Treatment Plan Summary: Daily contact with patient to assess and evaluate symptoms and progress in treatment, Medication management and Plan : Patient is seen and examined.  Patient is a 18 year old female with the above-stated past psychiatric history who is seen in follow-up.   Diagnosis: #1 major depression, recurrent, severe without psychotic features, #2 posttraumatic stress disorder  Patient stated that she  is feeling a bit better today.  She did sleep well.  She is complaining of some physical issues with regard to constipation and decreased appetite.  She continues on sertraline 25 mg p.o. daily as well as the hydroxyzine and  trazodone.  I am also adding MiraLAX today for constipation, also some Colace.  Otherwise we will continue her same treatment. 1.  Add Colace 100 mg p.o. daily as needed constipation. 2.  Add Ensure 2 times daily for nutritional supplementation. 3.  Continue hydroxyzine 25 mg p.o. 3 times daily as needed anxiety. 4.  Add MiraLAX 17 g of material in 8 ounces of water daily for constipation. 5.  Continue Zoloft 25 mg p.o. daily for depression and anxiety. 6.  Continue trazodone 50 mg p.o. nightly as needed insomnia. 7.  Disposition planning-in progress.  Antonieta PertGreg Lawson Clary, MD 08/31/2018, 9:23 AM
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
## 2018-08-31 NOTE — Progress Notes (Signed)
D: Pt denies SI/HI/AVH. Pt is pleasant and cooperative. Pt irritable about things that went on in her life and how she felt and responded to those things. Pt encouraged to embrace her feelings and work on controlling her actions and things that she says. Pt appeared receptive to information. Pt has good insight into Tx, and coping . But has limited experience dealing with various situations.   A: Pt was offered support and encouragement. Pt was given scheduled medications. Pt was encourage to attend groups. Q 15 minute checks were done for safety.   R:Pt attends groups and interacts well with peers and staff. Pt is taking medication. Pt has no complaints.Pt receptive to treatment and safety maintained on unit.

## 2018-08-31 NOTE — BHH Suicide Risk Assessment (Signed)
[place] INPATIENT:  Family/Significant Other Suicide Prevention Education  Suicide Prevention Education:  Patient Refusal for Family/Significant Other Suicide Prevention Education: The patient Donna Velez has refused to provide written consent for family/significant other to be provided Family/Significant Other Suicide Prevention Education during admission and/or prior to discharge.  Physician notified.  SPE completed with patient, as patient refused to consent to family contact. SPI pamphlet provided to pt and pt was encouraged to share information with support network, ask questions, and talk about any concerns relating to SPE. Patient denies access to guns/firearms and verbalized understanding of information provided. Mobile Crisis information also provided to patient.   Marylee Floras 08/31/2018, 3:07 PM

## 2018-09-01 MED ORDER — TRAZODONE HCL 50 MG PO TABS: 50.0000 mg | ORAL_TABLET | Freq: Every evening | ORAL | 0 refills | Status: AC | PRN

## 2018-09-01 MED ORDER — METRONIDAZOLE 500 MG PO TABS: 500.0000 mg | ORAL_TABLET | Freq: Two times a day (BID) | ORAL | 0 refills | Status: AC

## 2018-09-01 MED ORDER — SERTRALINE HCL 25 MG PO TABS: 25.0000 mg | ORAL_TABLET | Freq: Every day | ORAL | 0 refills | Status: AC

## 2018-09-01 MED ORDER — METRONIDAZOLE 500 MG PO TABS
500.0000 mg | ORAL_TABLET | Freq: Two times a day (BID) | ORAL | Status: DC
  Administered 2018-09-01: 500 mg via ORAL
  Filled 2018-09-01 (×5): qty 1

## 2018-09-01 NOTE — BHH Suicide Risk Assessment (Signed)
[facility] Discharge Suicide Risk Assessment   Principal Problem: <principal problem not specified> Discharge Diagnoses: Active Problems:   MDD (major depressive disorder)   Total Time spent with patient: 15 minutes  Musculoskeletal: Strength & Muscle Tone: within normal limits Gait & Station: normal Patient leans: N/A  Psychiatric Specialty Exam: Review of Systems  All other systems reviewed and are negative.   Blood pressure 102/68, pulse 77, temperature 98 F (36.7 C), temperature source Oral, resp. rate 20, height 5\' 7"  (1.702 m), weight 68.9 kg, SpO2 100 %.Body mass index is 23.81 kg/m.  General Appearance: Casual  Eye Contact::  Good  Speech:  Normal Rate409  Volume:  Normal  Mood:  Euthymic  Affect:  Congruent  Thought Process:  Coherent and Descriptions of Associations: Intact  Orientation:  Full (Time, Place, and Person)  Thought Content:  Logical  Suicidal Thoughts:  No  Homicidal Thoughts:  No  Memory:  Immediate;   Fair Recent;   Fair Remote;   Fair  Judgement:  Intact  Insight:  Good  Psychomotor Activity:  Normal  Concentration:  Good  Recall:  Good  Fund of Knowledge:Good  Language: Good  Akathisia:  Negative  Handed:  Right  AIMS (if indicated):     Assets:  Desire for Improvement Resilience  Sleep:  Number of Hours: 4.75  Cognition: WNL  ADL's:  Intact   Mental Status Per Nursing Assessment::   On Admission:  Suicidal ideation indicated by patient, Plan includes specific time, place, or method, Intention to act on suicide plan, Self-harm thoughts, Belief that plan would result in death, Intention to act on plan to harm others, Suicide plan, Self-harm behaviors  Demographic Factors:  Unemployed  Loss Factors: NA  Historical Factors: Impulsivity  Risk Reduction Factors:   Sense of responsibility to family and Positive social support  Continued Clinical Symptoms:  Depression:   Impulsivity  Cognitive Features That Contribute To Risk:  None     Suicide Risk:  Minimal: No identifiable suicidal ideation.  Patients presenting with no risk factors but with morbid ruminations; may be classified as minimal risk based on the severity of the depressive symptoms    Plan Of Care/Follow-up recommendations:  Activity:  ad lib  Sharma Covert, MD 09/01/2018, 10:45 AM

## 2018-09-01 NOTE — Progress Notes (Signed)
D: Pt denies SI/HI/AVH. Pt is pleasant and cooperative. Pt stated she was doing ok this evening. Pt visible on milieu A: Pt was offered support and encouragement. Pt was given scheduled medications. Pt was encourage to attend groups. Q 15 minute checks were done for safety.  R:Pt attends groups and interacts well with peers and staff. Pt is taking medication. Pt has no complaints.Pt receptive to treatment and safety maintained on unit.  Problem: Education: Goal: Emotional status will improve Outcome: Progressing   Problem: Education: Goal: Mental status will improve Outcome: Progressing

## 2018-09-01 NOTE — Progress Notes (Signed)
  [facility] Adult Case Management Discharge Plan :  Will you be returning to the same living situation after discharge:  Yes,  patient reports she is returning home  At discharge, do you have transportation home?: Yes,  patient reports her therapist is picking her up Do you have the ability to pay for your medications: Yes,  [employer]   Release of information consent forms completed and in the chart;  Patient's signature needed at discharge.  Patient to Follow up at: Follow-up Information    [facility] Follow up.   Specialty: Behavioral Health Why: Due to the holiday the office is closed. Please expect a call from the TEFL teacher on Monday with your medication management appointment.  Contact information: [place] [phone]          Next level of care provider has access to [place] and Suicide Prevention discussed: Yes,  with the patient      Has patient been referred to the Quitline?: N/A patient is not a smoker  Patient has been referred for addiction treatment: N/A  Marylee Floras, [place] 09/01/2018, 10:50 AM

## 2018-09-01 NOTE — Progress Notes (Signed)
[place] NOVEL CORONAVIRUS (COVID-19) DAILY CHECK-OFF SYMPTOMS - answer yes or no to each - every day NO YES  Have you had a fever in the past 24 hours?  . Fever (Temp > 37.80C / 100F) X   Have you had any of these symptoms in the past 24 hours? . New Cough .  Sore Throat  .  Shortness of Breath .  Difficulty Breathing .  Unexplained Body Aches   X   Have you had any one of these symptoms in the past 24 hours not related to allergies?   . Runny Nose .  Nasal Congestion .  Sneezing   X   If you have had runny nose, nasal congestion, sneezing in the past 24 hours, has it worsened?  X   EXPOSURES - check yes or no X   Have you traveled outside the state in the past 14 days?  X   Have you been in contact with someone with a confirmed diagnosis of COVID-19 or PUI in the past 14 days without wearing appropriate PPE?  X   Have you been living in the same home as a person with confirmed diagnosis of COVID-19 or a PUI (household contact)?    X   Have you been diagnosed with COVID-19?    X              What to do next: Answered NO to all: Answered YES to anything:   Proceed with unit schedule Follow the [facility] Inpatient Flowsheet.   

## 2018-09-01 NOTE — Discharge Summary (Signed)
Physician Discharge Summary Note  Patient:  Donna Velez is an 18 y.o., female MRN:  [id number] DOB:  2000/12/02 Patient phone:  [phone] (home)  Patient address:   [address] [place] [address],  Total Time spent with patient: 15 minutes  Date of Admission:  08/29/2018 Date of Discharge: 09/01/18  Reason for Admission:  Suicidal ideation  Principal Problem: <principal problem not specified> Discharge Diagnoses: Active Problems:   MDD (major depressive disorder)   Past Psychiatric History: One previous hospitalization in 2018 after suicide attempt.  Past Medical History:  Past Medical History:  Diagnosis Date  . 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 At risk for self harm 10/19/2016  . Insomnia 10/19/2016  . MDD (major depressive disorder), recurrent episode, severe ([place]) 10/19/2016  . Suicidal ideation 10/19/2016   History reviewed. No pertinent surgical history. Family History: History reviewed. No pertinent family history. Family Psychiatric  History: Patient is adopted but stated a history of addiction and alcohol use on both sides of biological family Social History:  Social History   Substance and Sexual Activity  Alcohol Use No     Social History   Substance and Sexual Activity  Drug Use Yes  . Types: Marijuana    Social History   Socioeconomic History  . Marital status: Single    Spouse name: Not on file  . Number of children: Not on file  . Years of education: Not on file  . Highest education level: Not on file  Occupational History  . Not on file  Social Needs  . Financial resource strain: Not on file  . Food insecurity    Worry: Not on file    Inability: Not on file  . Transportation needs    Medical: Not on file    Non-medical: Not on file  Tobacco Use  . Smoking status: Never Smoker  . Smokeless tobacco: Never Used  Substance and Sexual Activity  . Alcohol use: No  . Drug use: Yes    Types: Marijuana  . Sexual activity: Yes     Birth control/protection: Condom, I.U.D.  Lifestyle  . Physical activity    Days per week: Not on file    Minutes per session: Not on file  . Stress: Not on file  Relationships  . Social Herbalist on phone: Not on file    Gets together: Not on file    Attends religious service: Not on file    Active member of club or organization: Not on file    Attends meetings of clubs or organizations: Not on file    Relationship status: Not on file  Other Topics Concern  . Not on file  Social History Narrative  . Not on file    Hospital Course:  From admission H&P: Patient is a 17 year old female with a probable past psychiatric history significant for major depression as well as posttraumatic stress disorder who presented to the behavioral health hospital as a walk-in on 08/29/2018. She had been meeting with her counselor on the day of admission, and had disclosed that she was having suicidal thoughts. The patient endorsed thoughts with a plan to overdose. She also has cutting behaviors, and this seems to have increased recently. She stated that she had had increased psychosocial stressors of just trying to cope with life as an independent adult. 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 She also stated that she is lost 30 pounds recently secondary to a poor appetite, and admitted to nightmares and flashbacks from her previous trauma. She had a previous admission as an adolescent to the child psychiatry ward 1 to 2 years ago. She had been placed on Lamictal as well as Lexapro at that time. She stopped the Lamictal because she felt as though it numbed her emotions, but did continue on the Lexapro. She admitted to also feeling quite anxious. She was admitted to the hospital for evaluation and stabilization.  Ms. Donna Velez was admitted for depression with suicidal  ideation. She was started on Zoloft and trazodone. She participated in group therapy on the unit. She responded well to treatment with no adverse effects reported. [facility]. She is discharging on the medications listed below. She has shown improvement with improved mood, affect, sleep, appetite, and interaction. She denies any SI/HI/AVH and contracts for safety. [facility] (see below). Patient is provided with prescriptions for medications upon discharge. Her therapist is picking her up for discharge home.  Physical Findings: AIMS: Facial and Oral Movements Muscles of Facial Expression: None, normal Lips and Perioral Area: None, normal Jaw: None, normal Tongue: None, normal,Extremity Movements Upper (arms, wrists, hands, fingers): None, normal Lower (legs, knees, ankles, toes): None, normal, Trunk Movements Neck, shoulders, hips: None, normal, Overall Severity Severity of abnormal movements (highest score from questions above): None, normal Incapacitation due to abnormal movements: None, normal Patient's awareness of abnormal movements (rate only patient's report): No Awareness, Dental Status Current problems with teeth and/or dentures?: No Does patient usually wear dentures?: No  CIWA:  CIWA-Ar Total: 1 COWS:  COWS Total Score: 1  Musculoskeletal: Strength & Muscle Tone: within normal limits Gait & Station: normal Patient leans: N/A  Psychiatric Specialty Exam: Physical Exam  Nursing note and vitals reviewed. Constitutional: She is oriented to person, place, and time. She appears well-developed and well-nourished.  Cardiovascular: Normal rate.  Respiratory: Effort normal.  Neurological: She is alert and oriented to person, place, and time.    Review of Systems  Constitutional: Negative.   Psychiatric/Behavioral: Positive for depression (stable on medication) and substance  abuse (UDS +THC). Negative for hallucinations and suicidal ideas. The patient is not nervous/anxious and does not have insomnia.     Blood pressure 102/68, pulse 77, temperature 98 F (36.7 C), temperature source Oral, resp. rate 20, height 5\' 7"  (1.702 m), weight 68.9 kg, SpO2 100 %.Body mass index is 23.81 kg/m.  See MD's discharge SRA       Has this patient used any form of tobacco in the last 30 days? (Cigarettes, Smokeless Tobacco, Cigars, and/or Pipes)  No  Blood Alcohol level:  Lab Results  Component Value Date   ETH <10 08/30/2018    Metabolic Disorder Labs:  Lab Results  Component Value Date   HGBA1C 5.4 08/30/2018   MPG 108.28 08/30/2018   MPG 111.15 10/18/2016   No results found for: PROLACTIN Lab Results  Component Value Date   CHOL 96 08/30/2018   TRIG 42 08/30/2018   HDL 58 08/30/2018   CHOLHDL 1.7 08/30/2018   VLDL 8 08/30/2018   LDLCALC 30 08/30/2018    See Psychiatric Specialty Exam and Suicide Risk Assessment completed by Attending Physician prior to discharge.  Discharge destination:  Home  Is patient on multiple antipsychotic therapies at discharge:  No   Has Patient had three or more failed trials of antipsychotic monotherapy by history:  No  Recommended Plan for Multiple Antipsychotic Therapies: NA  Discharge Instructions    Discharge instructions   Complete by: As directed    Patient is instructed to take all prescribed medications as recommended. Report any side effects or adverse reactions to your outpatient psychiatrist. Patient is instructed to abstain from alcohol and illegal drugs while on prescription medications. In the event of worsening symptoms, patient is instructed to call the crisis hotline, 911, or go to the nearest emergency department for evaluation and treatment.     Allergies as of 09/01/2018      Reactions   Amoxicillin Hives         Medication List    TAKE these medications     Indication  metroNIDAZOLE 500 MG  tablet Commonly known as: FLAGYL Take 1 tablet (500 mg total) by mouth every 12 (twelve) hours.  Indication: Infection   sertraline 25 MG tablet Commonly known as: ZOLOFT Take 1 tablet (25 mg total) by mouth daily. Start taking on: September 02, 2018  Indication: Major Depressive Disorder   traZODone 50 MG tablet Commonly known as: DESYREL Take 1 tablet (50 mg total) by mouth at bedtime as needed for sleep.  Indication: Trouble Sleeping      Follow-up Information    [facility] Follow up.   Specialty: Behavioral Health Why: Due to the holiday the office is closed. Please expect a call from the Lawyerocial Worker Assistant on Monday with your medication management appointment.  Contact information: [address] [place] [id number] [phone]       [facility] Follow up.   Why: Please schedule a therapy appointment with your therapist Aishia Z. Griffin.   Contact information: [facility], 2nd Floor ph: [phone] fx: [phone]) [phone]          Follow-up recommendations: Activity as tolerated. Diet as recommended by primary care physician. Keep all scheduled follow-up appointments as recommended.   Comments:   Patient is instructed to take all prescribed medications as recommended. Report any side effects or adverse reactions to your outpatient psychiatrist. Patient is instructed to abstain from alcohol and illegal drugs while on prescription medications. In the event of worsening symptoms, patient is instructed to call the crisis hotline, 911, or go to the nearest emergency department for evaluation and treatment.  Signed: Aldean BakerJanet E Edda Orea, NP 09/01/2018, 10:57 AM

## 2018-09-01 NOTE — Progress Notes (Signed)
Patient ID: Donna Velez, female   DOB: 02-15-2001, 18 y.o.   MRN: [id number]  D: Pt alert and oriented on the unit.   A: Education, support, and encouragement provided. Discharge summary, medications and follow up appointments reviewed with pt. Suicide prevention resources provided, including "My 3 App." Pt's belongings in locker # 68 returned and belongings sheet signed.  R: Pt denies SI/HI, A/VH, pain, or any concerns at this time. Pt ambulatory on and off unit. Pt discharged to lobby.

## 2018-09-11 ENCOUNTER — Ambulatory Visit (HOSPITAL_COMMUNITY): Payer: Managed Care, Other (non HMO) | Admitting: Psychiatry

## 2018-09-11 ENCOUNTER — Other Ambulatory Visit: Payer: Self-pay

## 2018-12-26 ENCOUNTER — Other Ambulatory Visit: Payer: Self-pay

## 2018-12-26 ENCOUNTER — Encounter (HOSPITAL_COMMUNITY): Payer: Self-pay | Admitting: Emergency Medicine

## 2018-12-26 ENCOUNTER — Emergency Department (HOSPITAL_COMMUNITY): Payer: Managed Care, Other (non HMO)

## 2018-12-26 ENCOUNTER — Emergency Department (HOSPITAL_COMMUNITY)
Admission: EM | Admit: 2018-12-26 | Discharge: 2018-12-26 | Disposition: A | Discharge status: Home / Self Care | Payer: Managed Care, Other (non HMO) | Attending: Emergency Medicine | Admitting: Emergency Medicine

## 2018-12-26 DIAGNOSIS — M940 Chondrocostal junction syndrome [Tietze]: Secondary | ICD-10-CM | POA: Diagnosis not present

## 2018-12-26 DIAGNOSIS — R079 Chest pain, unspecified: Secondary | ICD-10-CM | POA: Diagnosis present

## 2018-12-26 LAB — CBC
HCT: 42.9 % (ref 36.0–46.0)
Hemoglobin: 14 g/dL (ref 12.0–15.0)
MCH: 29.5 pg (ref 26.0–34.0)
MCHC: 32.6 g/dL (ref 30.0–36.0)
MCV: 90.5 fL (ref 80.0–100.0)
Platelets: 259 10*3/uL (ref 150–400)
RBC: 4.74 MIL/uL (ref 3.87–5.11)
RDW: 12.6 % (ref 11.5–15.5)
WBC: 6.6 10*3/uL (ref 4.0–10.5)
nRBC: 0 % (ref 0.0–0.2)

## 2018-12-26 LAB — BASIC METABOLIC PANEL
Anion gap: 10 (ref 5–15)
BUN: 10 mg/dL (ref 6–20)
CO2: 24 mmol/L (ref 22–32)
Calcium: 9.9 mg/dL (ref 8.9–10.3)
Chloride: 106 mmol/L (ref 98–111)
Creatinine, Ser: 0.79 mg/dL (ref 0.44–1.00)
GFR calc Af Amer: >60 mL/min (ref >60)
GFR calc non Af Amer: >60 mL/min (ref >60)
Glucose, Bld: 101 mg/dL — ABNORMAL HIGH (ref 70–99)
Potassium: 3.2 mmol/L — ABNORMAL LOW (ref 3.5–5.1)
Sodium: 140 mmol/L (ref 135–145)

## 2018-12-26 LAB — I-STAT BETA HCG BLOOD, ED (NOT ORDERABLE): I-stat hCG, quantitative: <5 m[IU]/mL (ref <5)

## 2018-12-26 LAB — TROPONIN I (HIGH SENSITIVITY): Troponin I (High Sensitivity): <2 ng/L (ref <18)

## 2018-12-26 MED ORDER — SODIUM CHLORIDE 0.9% FLUSH: 3.0000 mL | Freq: Once | INTRAVENOUS | Status: DC

## 2018-12-26 MED ORDER — IBUPROFEN 600 MG PO TABS: 600.0000 mg | ORAL_TABLET | Freq: Three times a day (TID) | ORAL | 0 refills | Status: AC

## 2018-12-26 NOTE — ED Triage Notes (Signed)
Pt reports having chest pain for the last several weeks on right side of chest.

## 2018-12-26 NOTE — ED Provider Notes (Signed)
EKG Interpretation  Date/Time:  Tuesday December 26 2018 02:04:57 EDT Ventricular Rate:  78 PR Interval:    QRS Duration: 94 QT Interval:  383 QTC Calculation: 437 R Axis:   86 Text Interpretation: Sinus arrhythmia Early repolarization pattern No significant change since last tracing 22 Nov 2017 Confirmed by Rolland Porter [phone]) on 12/26/2018 2:31:07 AM         Rolland Porter, MD 12/26/18 0231

## 2018-12-26 NOTE — ED Provider Notes (Signed)
[place] DEPT Provider Note   CSN: [id number] Arrival date & time: 12/26/18  0121     History   Chief Complaint Chief Complaint  Patient presents with  . Chest Pain    HPI Donna Velez is a 18 y.o. female.     18yo female presents with complaint of pain to the front of her chest and back x several weeks. Patient thought at first her pain was due to anxiety but now is concerned she has costochondritis. Pain is similar to previous episodes of costochondritis. Denies SHOB, wheezing, cough, sick contacts, nausea, vomiting, changes in bowel or bladder habits, history of PE/DVT, lower extremity swelling. Patient is a daily smoker, quit 3 weeks ago with her symptoms. Not on OCP, no recent extended travel.      Past Medical History:  Diagnosis Date  . 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 At risk for self harm 10/19/2016  . Insomnia 10/19/2016  . MDD (major depressive disorder), recurrent episode, severe ([place]) 10/19/2016  . Suicidal ideation 10/19/2016    Patient Active Problem List   Diagnosis Date Noted  . MDD (major depressive disorder) 08/29/2018  . MDD (major depressive disorder), recurrent episode, severe ([place]) 10/19/2016  . 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 Suicidal ideation 10/19/2016  . At risk for self harm 10/19/2016  . Insomnia 10/19/2016    History reviewed. No pertinent surgical history.   OB History   No obstetric history on file.      Home Medications    Prior to Admission medications   Medication Sig Start Date End Date Taking? Authorizing Provider  ibuprofen (ADVIL) 600 MG tablet Take 1 tablet (600 mg total) by mouth 3 (three) times daily for 10 days. 12/26/18 01/05/19  Tacy Learn, PA-C  metroNIDAZOLE (FLAGYL) 500 MG tablet Take 1 tablet (500 mg total) by mouth every 12 (twelve) hours. Patient not taking: Reported on 12/26/2018 09/01/18   Connye Burkitt, NP  sertraline (ZOLOFT) 25 MG tablet Take 1  tablet (25 mg total) by mouth daily. Patient not taking: Reported on 12/26/2018 09/02/18   Connye Burkitt, NP  traZODone (DESYREL) 50 MG tablet Take 1 tablet (50 mg total) by mouth at bedtime as needed for sleep. Patient not taking: Reported on 12/26/2018 09/01/18   Connye Burkitt, NP    Family History History reviewed. No pertinent family history.  Social History Social History   Tobacco Use  . Smoking status: Never Smoker  . Smokeless tobacco: Never Used  Substance Use Topics  . Alcohol use: No  . Drug use: Yes    Types: Marijuana     Allergies   Amoxicillin   Review of Systems Review of Systems  Constitutional: Positive for chills. Negative for fever.  HENT: Negative for congestion and sore throat.   Respiratory: Negative for cough, chest tightness, shortness of breath and wheezing.   Cardiovascular: Positive for chest pain. Negative for palpitations and leg swelling.  Gastrointestinal: Negative for abdominal pain, constipation, diarrhea, nausea and vomiting.  Genitourinary: Negative for dysuria and frequency.  Musculoskeletal: Positive for back pain. Negative for arthralgias, gait problem, joint swelling, myalgias, neck pain and neck stiffness.  Skin: Negative for rash and wound.  Allergic/Immunologic: Negative for immunocompromised state.  Neurological: Negative for headaches.  Hematological: Negative for adenopathy.  Psychiatric/Behavioral: Negative for confusion.  All other systems reviewed and are negative.    Physical Exam Updated Vital Signs BP 134/70 (BP Location: Right Arm)   Pulse 90  Temp 99.1 F (37.3 C) (Oral)   Resp 15   Ht 5' 7.5" (1.715 m)   Wt 64.4 kg   LMP 12/25/2018   SpO2 97%   BMI 21.91 kg/m   Physical Exam Vitals signs and nursing note reviewed.  Constitutional:      General: She is not in acute distress.    Appearance: She is well-developed. She is not diaphoretic.  HENT:     Head: Normocephalic and atraumatic.  Neck:      Musculoskeletal: Normal range of motion and neck supple.  Cardiovascular:     Rate and Rhythm: Normal rate and regular rhythm.     Heart sounds: Normal heart sounds.  Pulmonary:     Effort: Pulmonary effort is normal.     Breath sounds: Normal breath sounds.  Chest:     Chest wall: Tenderness present. No deformity or crepitus.    Abdominal:     Palpations: Abdomen is soft.     Tenderness: There is no abdominal tenderness.  Musculoskeletal:        General: No swelling or signs of injury.     Right lower leg: She exhibits no tenderness. No edema.     Left lower leg: She exhibits no tenderness. No edema.  Skin:    General: Skin is warm and dry.     Findings: No erythema or rash.  Neurological:     Mental Status: She is alert and oriented to person, place, and time.  Psychiatric:        Behavior: Behavior normal.      ED Treatments / Results  Labs (all labs ordered are listed, but only abnormal results are displayed) Labs Reviewed  BASIC METABOLIC PANEL - Abnormal; Notable for the following components:      Result Value   Potassium 3.2 (*)    Glucose, Bld 101 (*)    All other components within normal limits  CBC  I-STAT BETA HCG BLOOD, ED (NOT ORDERABLE)  TROPONIN I (HIGH SENSITIVITY)  TROPONIN I (HIGH SENSITIVITY)    EKG EKG Interpretation  Date/Time:  Tuesday December 26 2018 02:04:57 EDT Ventricular Rate:  78 PR Interval:    QRS Duration: 94 QT Interval:  383 QTC Calculation: 437 R Axis:   86 Text Interpretation: Sinus arrhythmia Early repolarization pattern No significant change since last tracing 22 Nov 2017 Confirmed by Devoria Albe ([id number]) on 12/26/2018 2:31:07 AM   Radiology Dg Chest 2 View  Result Date: 12/26/2018 CLINICAL DATA:  Chest pain and shortness of breath for 1 week EXAM: CHEST - 2 VIEW COMPARISON:  11/23/2017 FINDINGS: The heart size and mediastinal contours are within normal limits. Both lungs are clear. The visualized skeletal structures are  unremarkable. IMPRESSION: No active cardiopulmonary disease. Electronically Signed   By: Alcide Clever M.D.   On: 12/26/2018 02:15    Procedures Procedures (including critical care time)  Medications Ordered in ED Medications  sodium chloride flush (NS) 0.9 % injection 3 mL (has no administration in time range)     Initial Impression / Assessment and Plan / ED Course  I have reviewed the triage vital signs and the nursing notes.  Pertinent labs & imaging results that were available during my care of the patient were reviewed by me and considered in my medical decision making (see chart for details).  Clinical Course as of Dec 25 900  Tue Dec 26, 2018  0858 18yo female with complaint of chest wall pain x 3 weeks, similar to prior  episodes of costochondritis. Chest wall pain on exam, no crepitus or skin changes. No acute ischemic changes on EKG, CBC, BMP, without significant changes. Troponin x 1 negative. Hcg negative. Suspect costochondritis. Plan is to treat with NSAID and recommend follow up with PCP.    [LM]    Clinical Course User Index [LM] Jeannie FendMurphy, Kalisa Girtman A, PA-C      Final Clinical Impressions(s) / ED Diagnoses   Final diagnoses:  Costochondritis    ED Discharge Orders         Ordered    ibuprofen (ADVIL) 600 MG tablet  3 times daily     12/26/18 0834           Jeannie FendMurphy, Martino Tompson A, PA-C 12/26/18 [id number]    Gwyneth SproutPlunkett, [place], MD 12/26/18 2135

## 2018-12-26 NOTE — Discharge Instructions (Addendum)
Take Motrin as prescribed with food. STOP taking if you develop abdominal pain. Follow up with your doctor to discuss recurrent chest wall pain.

## 2019-02-28 ENCOUNTER — Emergency Department (HOSPITAL_COMMUNITY): Payer: Managed Care, Other (non HMO)

## 2019-02-28 ENCOUNTER — Emergency Department (HOSPITAL_COMMUNITY)
Admission: EM | Admit: 2019-02-28 | Discharge: 2019-02-28 | Disposition: A | Discharge status: Home / Self Care | Payer: Managed Care, Other (non HMO) | Attending: Emergency Medicine | Admitting: Emergency Medicine

## 2019-02-28 ENCOUNTER — Encounter (HOSPITAL_COMMUNITY): Payer: Self-pay | Admitting: Emergency Medicine

## 2019-02-28 DIAGNOSIS — R630 Anorexia: Secondary | ICD-10-CM | POA: Diagnosis not present

## 2019-02-28 DIAGNOSIS — R102 Pelvic and perineal pain: Secondary | ICD-10-CM | POA: Insufficient documentation

## 2019-02-28 DIAGNOSIS — N939 Abnormal uterine and vaginal bleeding, unspecified: Secondary | ICD-10-CM | POA: Diagnosis present

## 2019-02-28 DIAGNOSIS — F1210 Cannabis abuse, uncomplicated: Secondary | ICD-10-CM | POA: Diagnosis not present

## 2019-02-28 LAB — COMPREHENSIVE METABOLIC PANEL
ALT: 13 U/L (ref 0–44)
AST: 20 U/L (ref 15–41)
Albumin: 4.6 g/dL (ref 3.5–5.0)
Alkaline Phosphatase: 83 U/L (ref 38–126)
Anion gap: 7 (ref 5–15)
BUN: 10 mg/dL (ref 6–20)
CO2: 26 mmol/L (ref 22–32)
Calcium: 9.6 mg/dL (ref 8.9–10.3)
Chloride: 106 mmol/L (ref 98–111)
Creatinine, Ser: 0.8 mg/dL (ref 0.44–1.00)
GFR calc Af Amer: >60 mL/min (ref >60)
GFR calc non Af Amer: >60 mL/min (ref >60)
Glucose, Bld: 90 mg/dL (ref 70–99)
Potassium: 4 mmol/L (ref 3.5–5.1)
Sodium: 139 mmol/L (ref 135–145)
Total Bilirubin: 0.5 mg/dL (ref 0.3–1.2)
Total Protein: 7.8 g/dL (ref 6.5–8.1)

## 2019-02-28 LAB — LIPASE, BLOOD: Lipase: 30 U/L (ref 11–51)

## 2019-02-28 LAB — CBC
HCT: 41.4 % (ref 36.0–46.0)
Hemoglobin: 13.8 g/dL (ref 12.0–15.0)
MCH: 30.1 pg (ref 26.0–34.0)
MCHC: 33.3 g/dL (ref 30.0–36.0)
MCV: 90.4 fL (ref 80.0–100.0)
Platelets: 242 10*3/uL (ref 150–400)
RBC: 4.58 MIL/uL (ref 3.87–5.11)
RDW: 13 % (ref 11.5–15.5)
WBC: 4.3 10*3/uL (ref 4.0–10.5)
nRBC: 0 % (ref 0.0–0.2)

## 2019-02-28 LAB — URINALYSIS, ROUTINE W REFLEX MICROSCOPIC
Bilirubin Urine: NEGATIVE
Glucose, UA: NEGATIVE mg/dL
Ketones, ur: NEGATIVE mg/dL
Leukocytes,Ua: NEGATIVE
Nitrite: NEGATIVE
Protein, ur: NEGATIVE mg/dL
Specific Gravity, Urine: 1.023 (ref 1.005–1.030)
pH: 5 (ref 5.0–8.0)

## 2019-02-28 LAB — I-STAT BETA HCG BLOOD, ED (MC, WL, AP ONLY): I-stat hCG, quantitative: <5 m[IU]/mL (ref <5)

## 2019-02-28 NOTE — Discharge Instructions (Signed)
Your laboratory results are within normal limits today.  You will ultimately need to follow-up with a gynecologist in order to have the vaginal bleeding treated.  The number to [facility]ne[facility] is attached to your chart, please call to schedule an appointment.

## 2019-02-28 NOTE — ED Provider Notes (Signed)
[facility]-EMERGENCY DEPT Provider Note   CSN: [id number] Arrival date & time: 02/28/19  1359     History Chief Complaint  Patient presents with   Vaginal Bleeding   Abdominal Pain    Elloise Velez is a 18 y.o. female.  18 y.o female with a PMH of Anxiety, MDD, Insomnia presents to the ED with a chief complaint of vaginal bleeding x few months. Patient had her paraguard IUD removed in July, states her vaginal bleeding has been ongoing since. She reports pelvic pain along the suprapubic area, no alleviating or exacerbating factors. She is currently sexually active, reports last episode 2 weeks ago. She also endorses anorexia.  Patient has had a previous visit in the ED for vaginal bleeding after her IUD last year.  She reports she never followed up with GYN.  No nausea, vomiting, diarrhea, fevers, urinary symptoms.  The history is provided by the patient.  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 No pertinent surgical history.   OB History    No obstetric history on file.     No family history on file.  Social History   Tobacco Use   Smoking status: Never Smoker   Smokeless tobacco: Never Used  Substance Use Topics   Alcohol use: No   Drug use: Yes    Types: Marijuana    Home Medications Prior to Admission medications   Medication Sig Start Date End Date Taking? Authorizing Provider  metroNIDAZOLE (FLAGYL) 500 MG tablet Take 1 tablet (500 mg total) by mouth every 12 (twelve) hours. Patient not taking: Reported on 12/26/2018 09/01/18   Aldean Baker, NP  sertraline (ZOLOFT) 25 MG tablet Take 1 tablet (25 mg total) by mouth daily. Patient not taking: Reported on 12/26/2018 09/02/18   Aldean Baker, NP  traZODone (DESYREL) 50 MG tablet Take 1 tablet (50 mg total) by mouth at bedtime as needed for sleep. Patient not taking: Reported on 12/26/2018 09/01/18   Aldean Baker, NP    Allergies    Amoxicillin  Review of Systems   Review of Systems  Constitutional: Negative for fever.  HENT: Negative for sinus pressure and voice change.   Respiratory: Negative for shortness of breath.   Cardiovascular: Negative for chest pain.  Gastrointestinal: Positive for abdominal pain.  Genitourinary: Positive for vaginal bleeding. Negative for flank pain.  Musculoskeletal: Negative for back pain.  Skin: Negative for pallor and wound.  Neurological: Negative for headaches.    Physical Exam Updated Vital Signs BP 122/80 (BP Location: Left Arm)    Pulse  71    Temp 98.1 F (36.7 C) (Oral)    Resp 18    LMP 02/28/2019    SpO2 99%   Physical Exam Vitals and nursing note reviewed. Exam conducted with a chaperone present.  Constitutional:      General: She is not in acute distress.    Appearance: She is well-developed.  HENT:     Head: Normocephalic and atraumatic.     Mouth/Throat:     Pharynx: No oropharyngeal exudate.  Eyes:     Pupils: Pupils are equal, round, and reactive to light.  Cardiovascular:     Rate and  Rhythm: Regular rhythm.     Heart sounds: Normal heart sounds.  Pulmonary:     Effort: Pulmonary effort is normal. No respiratory distress.     Breath sounds: Normal breath sounds.  Abdominal:     General: Bowel sounds are normal. There is no distension.     Palpations: Abdomen is soft.     Tenderness: There is no abdominal tenderness.  Genitourinary:    Vagina: Bleeding present.     Cervix: Erythema and cervical bleeding present. No cervical motion tenderness or discharge.     Uterus: Normal.      Adnexa:        Right: No tenderness.         Left: No tenderness.       Comments: Significant amount of bleeding noted on vaginal vault.  No irregular discharge noted.  No CMT. Chaperoned by Kerby Less.  Musculoskeletal:        General: No tenderness or deformity.     Cervical back: Normal range of motion.     Right lower leg: No edema.     Left lower leg: No edema.  Skin:    General: Skin is warm and dry.  Neurological:     Mental Status: She is alert and oriented to person, place, and time.     ED Results / Procedures / Treatments   Labs (all labs ordered are listed, but only abnormal results are displayed) Labs Reviewed  URINALYSIS, ROUTINE W REFLEX MICROSCOPIC - Abnormal; Notable for the following components:      Result Value   Hgb urine dipstick MODERATE (*)    Bacteria, UA RARE (*)    All other components within normal limits  LIPASE, BLOOD  COMPREHENSIVE METABOLIC PANEL  CBC  I-STAT BETA HCG BLOOD, ED ([facility], [facility], [facility] ONLY)    EKG None  Radiology US Transvaginal Non-OB  Result Date: 02/28/2019 CLINICAL DATA:  Initial evaluation for vaginal bleeding with irregular cycles for several months. EXAM: TRANSABDOMINAL AND TRANSVAGINAL ULTRASOUND OF PELVIS TECHNIQUE: Both transabdominal and transvaginal ultrasound examinations of the pelvis were performed. Transabdominal technique was performed for global imaging of the pelvis including uterus, ovaries, adnexal regions, and  pelvic cul-de-sac. It was necessary to proceed with endovaginal exam following the transabdominal exam to visualize the uterus, endometrium, and ovaries. COMPARISON:  None available. FINDINGS: Uterus Measurements: 5.9 x 2.9 x 3.2 cm = volume: 29 mL. No fibroids or other mass visualized. Endometrium Thickness: 5.2 mm.  No focal abnormality visualized. Right ovary Measurements: 3.0 x 1.4 x 2.1 cm = volume: 4.7 mL. Normal appearance/no adnexal mass. Left ovary Measurements: 2.7 x 1.3 x 1.4 cm = volume: 2.6 mL. Normal appearance/no adnexal mass. Other findings Small volume free fluid within the pelvis, presumably physiologic. IMPRESSION: 1. Endometrial stripe measures 5.2 mm in thickness. If bleeding remains unresponsive to hormonal or medical therapy, sonohysterogram  should be considered for focal lesion work-up. (Ref: Radiological Reasoning: Algorithmic Workup of Abnormal Vaginal Bleeding with Endovaginal Sonography and Sonohysterography. AJR 2008; 086:V78-46). 2. Otherwise unremarkable and normal pelvic ultrasound. No other acute abnormality. Electronically Signed   By: Jeannine Boga M.D.   On: 02/28/2019 18:50   US Pelvis Complete  Result Date: 02/28/2019 CLINICAL DATA:  Initial evaluation for vaginal bleeding with irregular cycles for several months. EXAM: TRANSABDOMINAL AND TRANSVAGINAL ULTRASOUND OF PELVIS TECHNIQUE: Both transabdominal and transvaginal ultrasound examinations of the pelvis were performed. Transabdominal technique was performed for global imaging of the pelvis including uterus, ovaries, adnexal regions, and pelvic cul-de-sac. It was necessary to proceed with endovaginal exam following the transabdominal exam to visualize the uterus, endometrium, and ovaries. COMPARISON:  None available. FINDINGS: Uterus Measurements: 5.9 x 2.9 x 3.2 cm = volume: 29 mL. No fibroids or other mass visualized. Endometrium Thickness: 5.2 mm.  No focal abnormality visualized. Right ovary Measurements: 3.0 x  1.4 x 2.1 cm = volume: 4.7 mL. Normal appearance/no adnexal mass. Left ovary Measurements: 2.7 x 1.3 x 1.4 cm = volume: 2.6 mL. Normal appearance/no adnexal mass. Other findings Small volume free fluid within the pelvis, presumably physiologic. IMPRESSION: 1. Endometrial stripe measures 5.2 mm in thickness. If bleeding remains unresponsive to hormonal or medical therapy, sonohysterogram should be considered for focal lesion work-up. (Ref: Radiological Reasoning: Algorithmic Workup of Abnormal Vaginal Bleeding with Endovaginal Sonography and Sonohysterography. AJR 2008; [id number]:X52-84). 2. Otherwise unremarkable and normal pelvic ultrasound. No other acute abnormality. Electronically Signed   By: Jeannine Boga M.D.   On: 02/28/2019 18:50    Procedures Procedures (including critical care time)  Medications Ordered in ED Medications - No data to display  ED Course  I have reviewed the triage vital signs and the nursing notes.  Pertinent labs & imaging results that were available during my care of the patient were reviewed by me and considered in my medical decision making (see chart for details).    MDM Rules/Calculators/A&P   Patient with a past medical history of dysfunctional uterine bleeding presents to the ED with complaints of vaginal bleeding along with pelvic pain for the past 2 days.  Patient has had a prior visit in the past for the same complaint, reports she never followed up with GYN.  She did have a copper IUD removed in July 2020, she voices concerns for "there might be scar tissue left in my uterus, I need ultrasound ".  Have explained to patient that this was removed several months ago and her menstrual cycle is likely to change after removal of IUD.  Patient rates her abdominal pain a 10 out of 10, 1 attempted to give patient medication to treat her pain, she denies this and is requesting only ultrasound be done today.  CMP is unremarkable.  CBC with a stable hemoglobin and no  signs of anemia.  Her hCG is negative.  UA with moderate amount of hemoglobin, rare bacteria.  She is currently sexually active, last time 2 weeks ago, there is no changes in the pattern her bleeding after her period.  Will perform pelvic exam in order to further evaluate patient.  Pelvic exam performed by me, significant amount of bleeding on vaginal vault, no irregular discharge.  No CMT or adnexal tenderness.  Will obtain ultrasound at patient's request.  She will ultimately need to follow-up with GYN.  Ultrasound Pelvic showed: 1. Endometrial stripe measures 5.2 mm in thickness. If bleeding  remains unresponsive to hormonal  or medical therapy, sonohysterogram  should be considered for focal lesion work-up. (Ref: Radiological  Reasoning: Algorithmic Workup of Abnormal Vaginal Bleeding with  Endovaginal Sonography and Sonohysterography. AJR 2008; 161:W96-04; 191:S68-73).  2. Otherwise unremarkable and normal pelvic ultrasound. No other  acute abnormality.     These results were discussed at length with patient.  [facility], she is aware that she will ultimately need to be placed on medication in order to have bleeding subside.  No acute process at this time, I have explained to patient the importance of following up with GYN.  She agrees of making an appointment.  Return precautions discussed at length.   Portions of this note were generated with Scientist, clinical (histocompatibility and immunogenetics)Dragon dictation software. Dictation errors may occur despite best attempts at proofreading.  Final Clinical Impression(s) / ED Diagnoses Final diagnoses:  Vaginal bleeding    Rx / DC Orders ED Discharge Orders    None       Claude MangesSoto, Larri Yehle, PA-C 02/28/19 Teodoro [id number]    Schlossman, Erin, MD 03/01/19 1343

## 2019-02-28 NOTE — ED Triage Notes (Signed)
Per EMS, patient c/o heavy vaginal bleeding x2 days. Reports IUD was removed in July and menstrual cycles are worsening each month. Patient reports consistent abdominal pain worsening during intercourse.

## 2019-02-28 NOTE — ED Notes (Signed)
Pt refused discharge vital signs

## 2019-04-27 ENCOUNTER — Encounter: Admit: 2019-04-27 | Payer: PRIVATE HEALTH INSURANCE

## 2019-04-27 ENCOUNTER — Inpatient Hospital Stay: Admit: 2019-04-27 | Discharge: 2019-04-27 | Payer: PRIVATE HEALTH INSURANCE

## 2019-04-27 DIAGNOSIS — F419 Anxiety disorder, unspecified: Secondary | ICD-10-CM

## 2019-04-27 DIAGNOSIS — F32A Depression: Secondary | ICD-10-CM

## 2019-04-27 DIAGNOSIS — F319 Bipolar disorder, unspecified: Secondary | ICD-10-CM

## 2019-04-27 DIAGNOSIS — A64 Unspecified sexually transmitted disease: Secondary | ICD-10-CM

## 2019-04-27 DIAGNOSIS — R6889 Other general symptoms and signs: Secondary | ICD-10-CM

## 2019-04-27 DIAGNOSIS — IMO0001 No significant past medical history: Secondary | ICD-10-CM

## 2019-04-27 LAB — URINE DRUG SCREEN W/ NO CONF   (BH GH LMH YH)
BKR AMPHETAMINE SCREEN, URINE, NO CONF.: NEGATIVE
BKR BARBITURATE SCREEN, URINE, NO CONF.: NEGATIVE
BKR BENZODIAZEPINES SCREEN, URINE, NO CONF.: NEGATIVE
BKR CANNABINOIDS SCREEN, URINE, NO CONF.: POSITIVE — AB
BKR COCAINE SCREEN, URINE, NO CONF.: NEGATIVE
BKR METHADONE METABOLITE SCREEN, URINE, NO CONF.: NEGATIVE
BKR OPIATES SCREEN, URINE, NO CONF.: NEGATIVE
BKR OXYCODONE SCREEN, URINE, NO CONF.: NEGATIVE
BKR PHENCYCLIDINE (PCP) SCREEN, URINE, NO CONF.: NEGATIVE

## 2019-04-27 NOTE — Plan of Care
Plan of Care Overview/ Patient Status    Emergency Room Psychiatric Plan of Care Plan of Care Note Type:  Initial Plan of CareSafety Intervention(s):  Assumed care of patient, Patient oriented to unit including bathroom, shower, phone, visiting policy and patient rights, Routine safety checks per unit policy, Patient identification verified and Patient notified of video monitoringSupervision Intervention(s):  Standard/Routine per unit policyLegal Status Intervention(s):  Not yet determinedMedication Intervention(s):  Medications administered as orderedVital Signs Intervention(s):  Vital signs obtained as orderedLab/Procedure Intervention(s):  Lab(s) obtained as orderedDiet Intervention(s):  Meals, snacks and beverages offerredPain Intervention(s):  Pain assessedADL's Intervention(s):  Bathroom facilities offered, Shower facilities offered and Assistance offered as neededSleep Intervention(s):  Sleep and relaxation offered and promotedSupport Intervention(s):  Staff supportGoal - Patient verbalizes an understanding for requiring a psychiatric evaluation in the Emergency Department:  ActiveGoal - Patient demonstrates controlled behavior:  ActiveGoal - Patient utilizes self-discipline and coping strategies to reduce anxiety:  ActiveGoal - Patient verbalizes to staff if he/she feels unsafe and/or plans to hurt self/others:  ActiveShift DocumentationSee ED noteKristen Toni Arthurs, RN02/26/214:49 PM
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
## 2019-04-28 NOTE — Discharge Instructions
Take care of yourself; Follow your heart and your inner voiceContinue with Mindy Sanford

## 2019-04-28 NOTE — ED Notes
7:53 PM assumed pt care. Pt resting quietly on her gurney. safety checks in place.8:38 PM pt voiced no acute concerns as of this time. Discharged papers given by APRN. Pt left the unit with all her belongings and escorted by unit staff to the main ED waiting room. Taxi services arranged for pt.

## 2021-05-04 IMAGING — US US PELVIS COMPLETE
1 series · 13 of 25 positions shown · non-contrast
Comparison: None available.

CLINICAL DATA: Initial evaluation for vaginal bleeding with
irregular cycles for several months.



[Series 1: us pelvis complete · 13 of 96 slices shown]
[im 1/96]
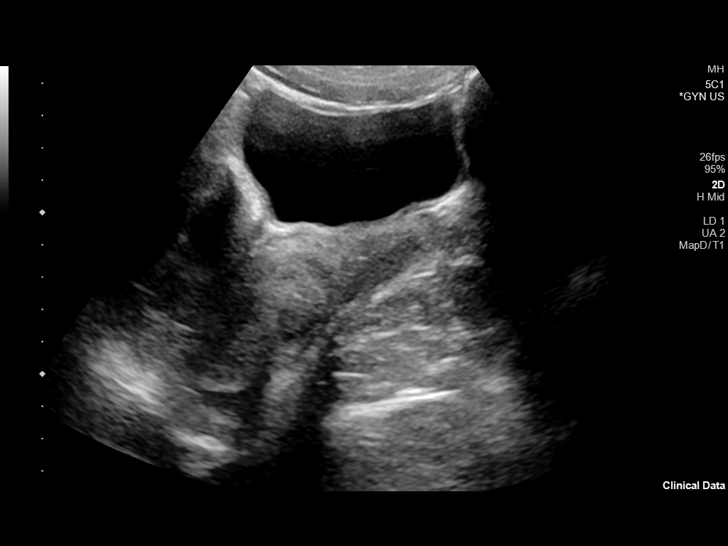
[im 8/96]
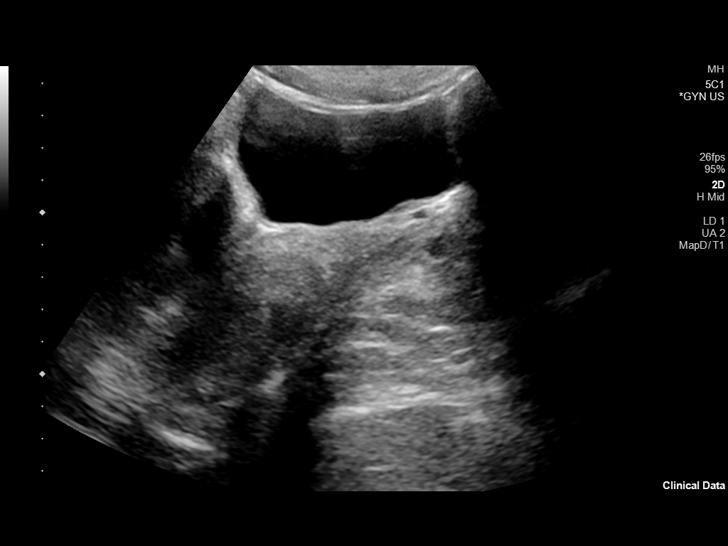
[im 16/96]
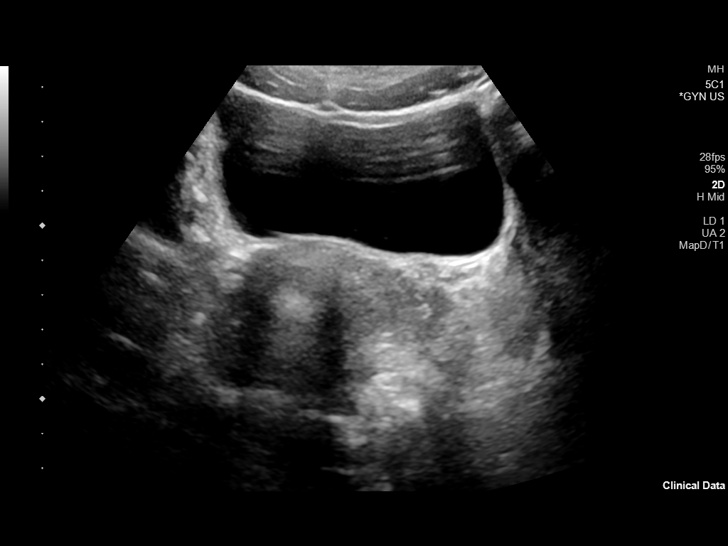
[im 24/96]
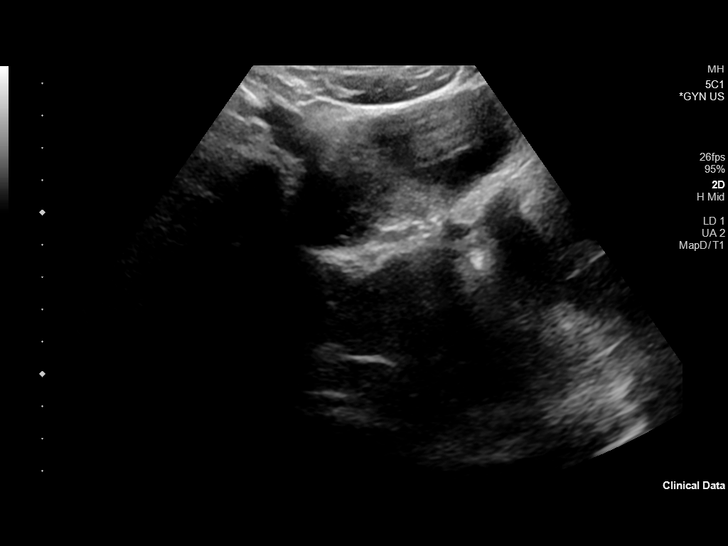
[im 32/96]
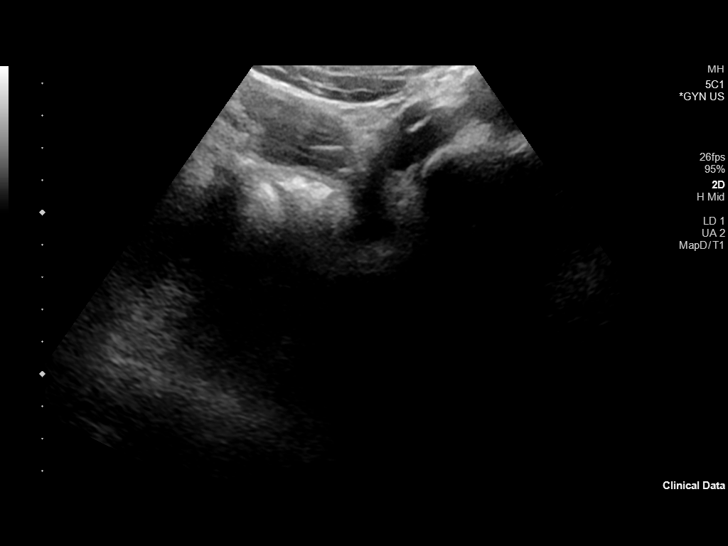
[im 40/96]
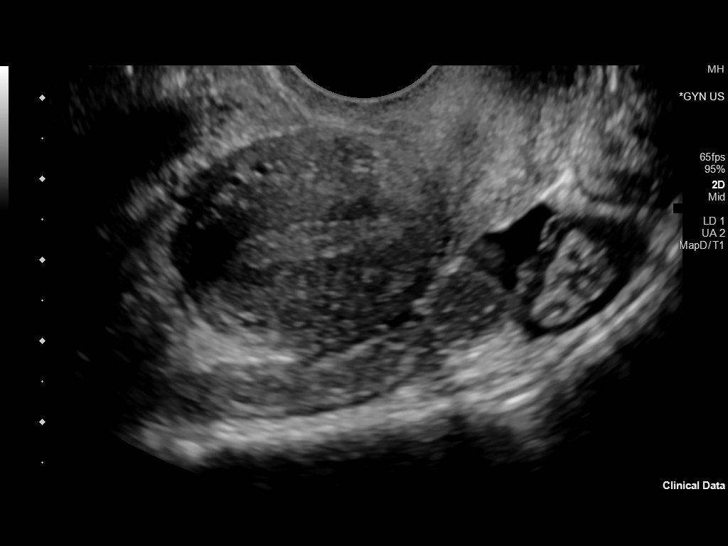
[im 48/96]
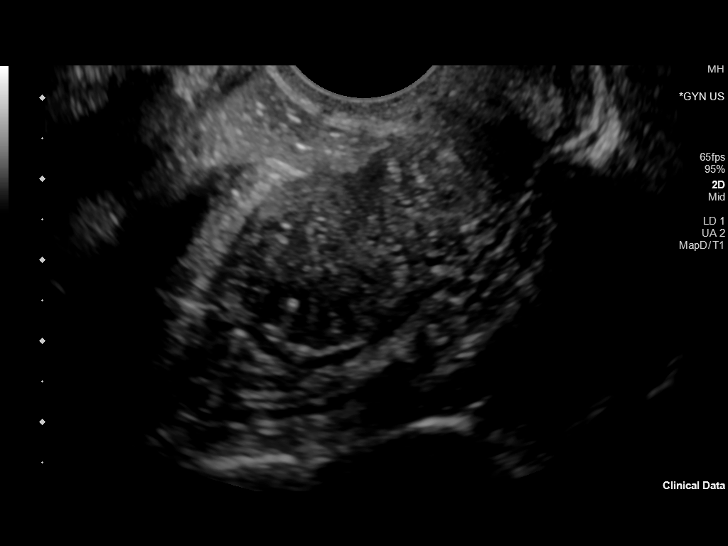
[im 56/96]
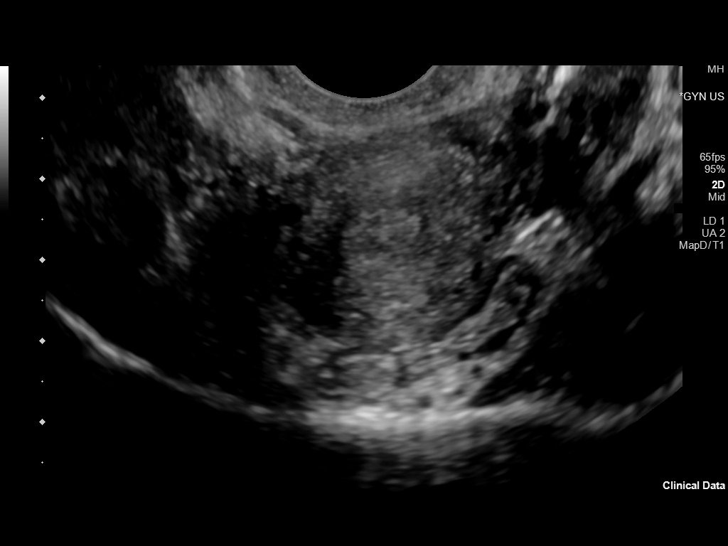
[im 64/96]
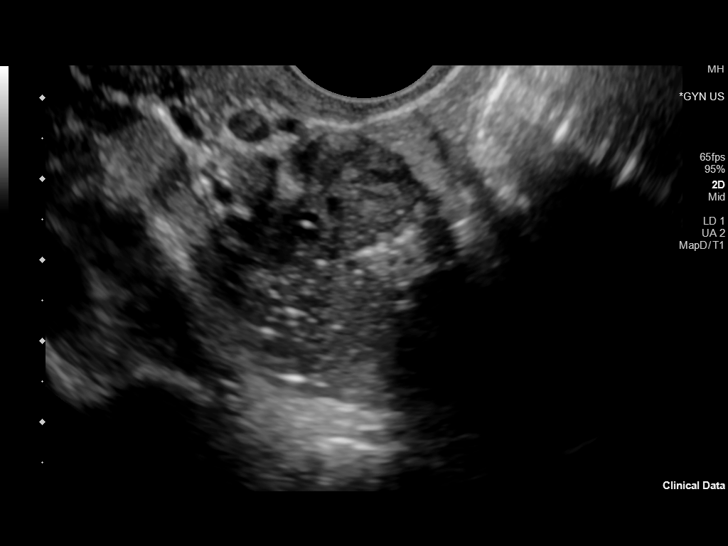
[im 72/96]
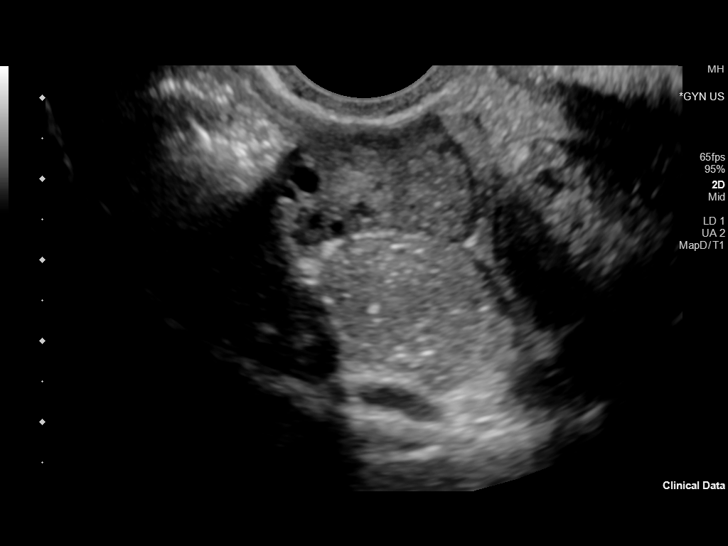
[im 80/96]
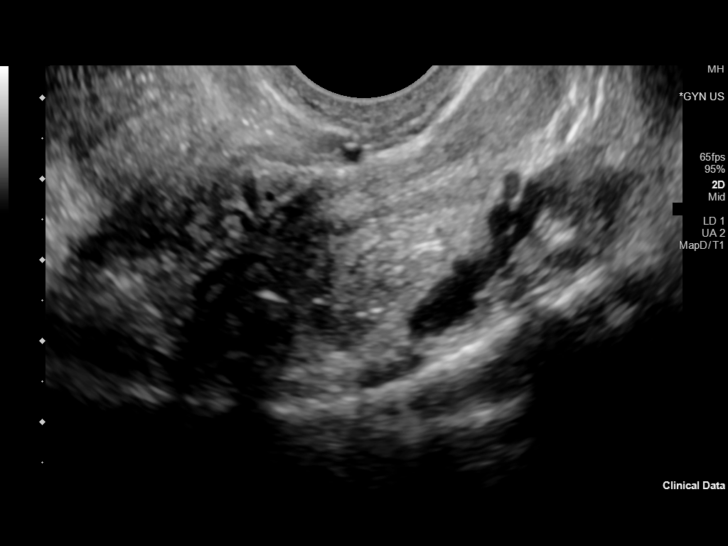
[im 88/96]
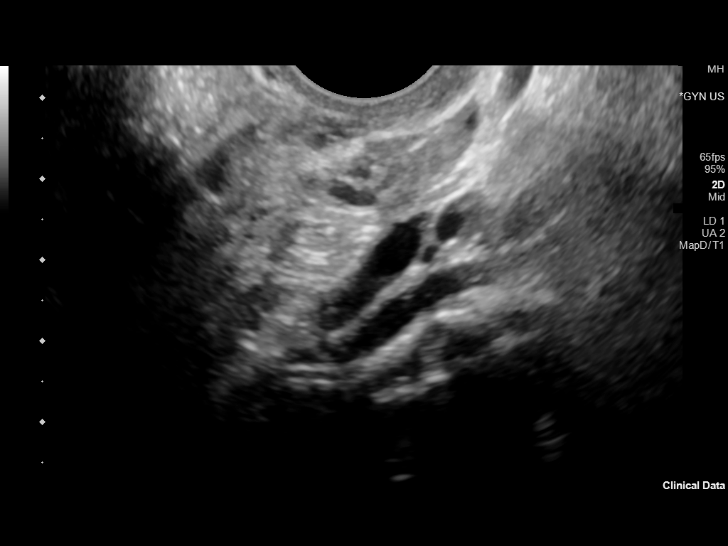
[im 96/96]
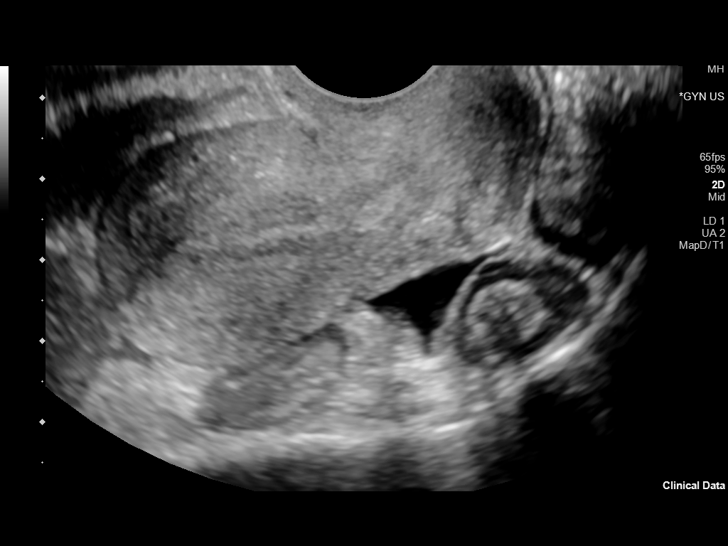

[13 of 25 positions shown; findings below may reference images not displayed]

FINDINGS: Uterus

Measurements: 5.9 x 2.9 x 3.2 cm = volume: 29 mL. No fibroids or
other mass visualized.

Endometrium

Thickness: 5.2 mm.  No focal abnormality visualized.

Right ovary

Measurements: 3.0 x 1.4 x 2.1 cm = volume: 4.7 mL. Normal
appearance/no adnexal mass.

Left ovary

Measurements: 2.7 x 1.3 x 1.4 cm = volume: 2.6 mL. Normal
appearance/no adnexal mass.

Other findings

Small volume free fluid within the pelvis, presumably physiologic.
IMPRESSION: 1. Endometrial stripe measures 5.2 mm in thickness. If bleeding
remains unresponsive to hormonal or medical therapy, sonohysterogram
should be considered for focal lesion work-up. (Ref: Radiological
Reasoning: Algorithmic Workup of Abnormal Vaginal Bleeding with
Endovaginal Sonography and Sonohysterography. [id number] [id number]; 191:S68-73).
2. Otherwise unremarkable and normal pelvic ultrasound. No other
acute abnormality.

## 2021-05-04 IMAGING — US US TRANSVAGINAL NON-OB
1 series · 13 of 25 positions shown · non-contrast
Comparison: None available.

CLINICAL DATA: Initial evaluation for vaginal bleeding with
irregular cycles for several months.



[Series 1: us transvaginal non-ob · 13 of 96 slices shown]
[im 1/96]
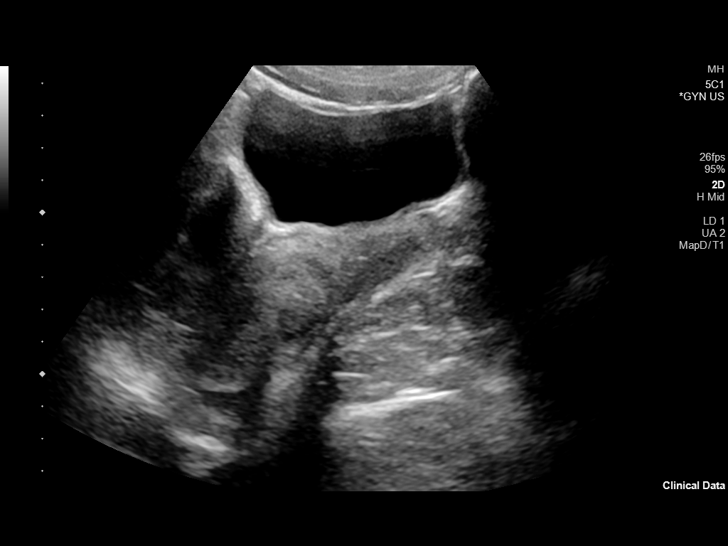
[im 8/96]
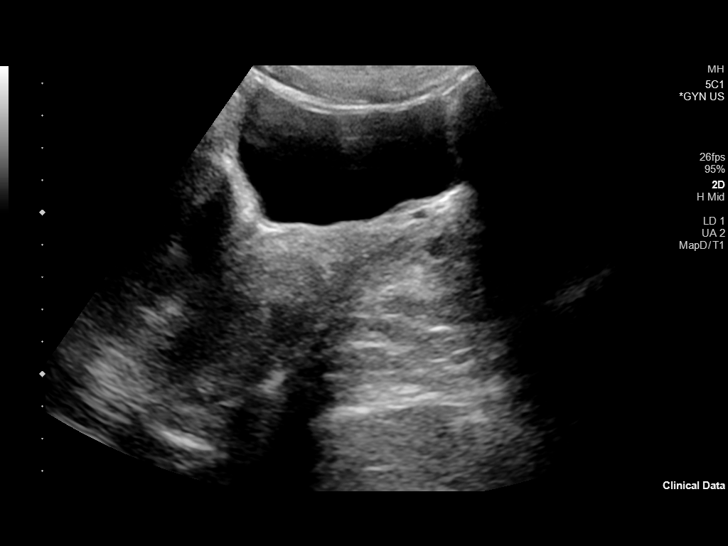
[im 16/96]
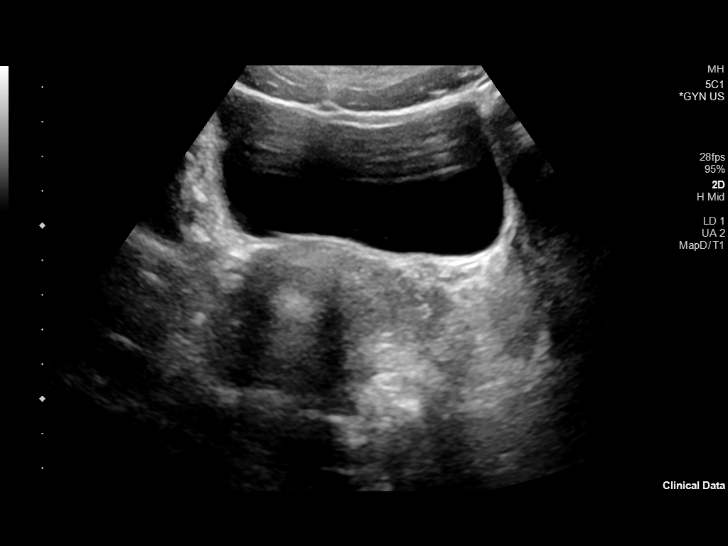
[im 24/96]
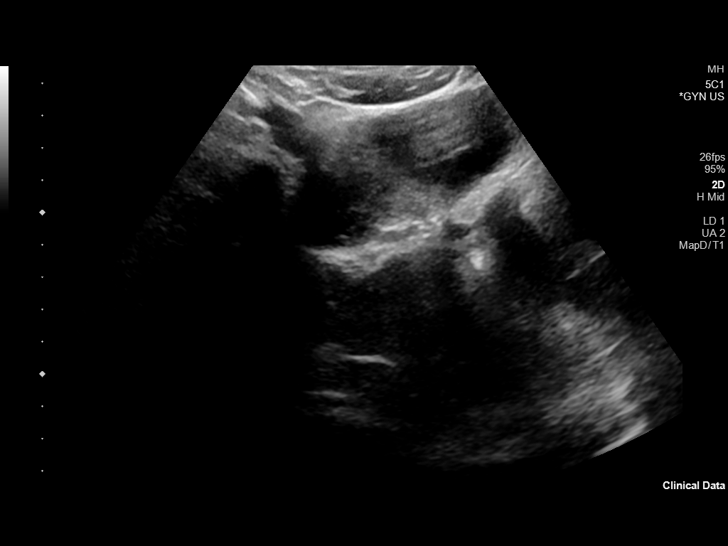
[im 32/96]
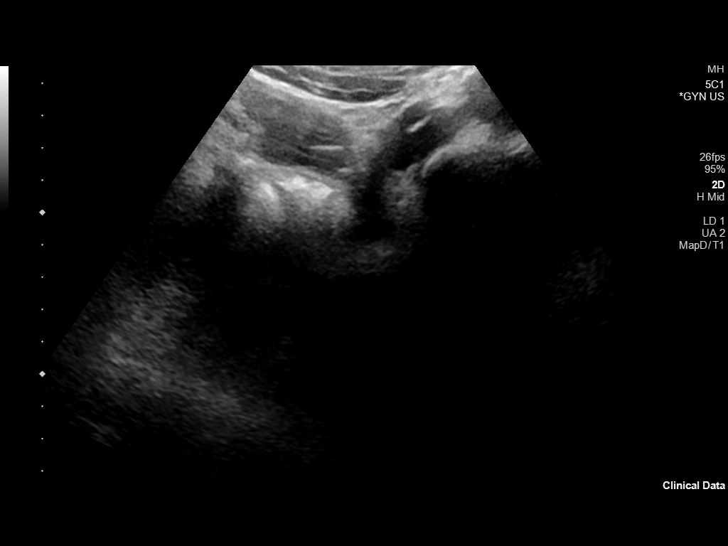
[im 40/96]
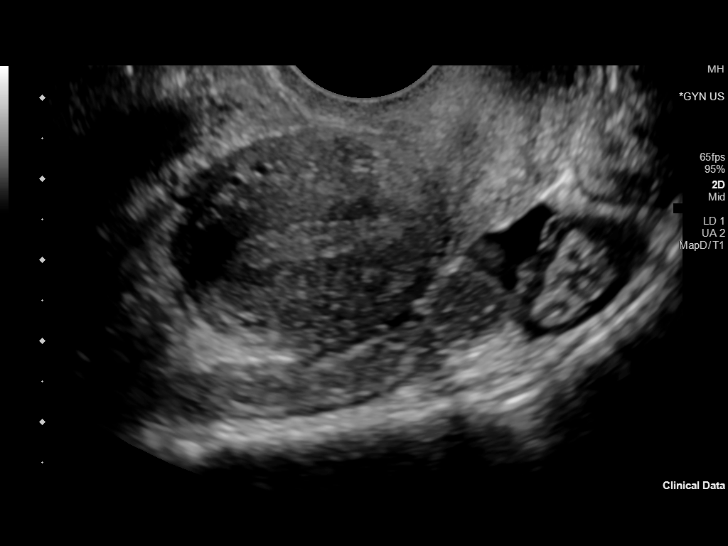
[im 48/96]
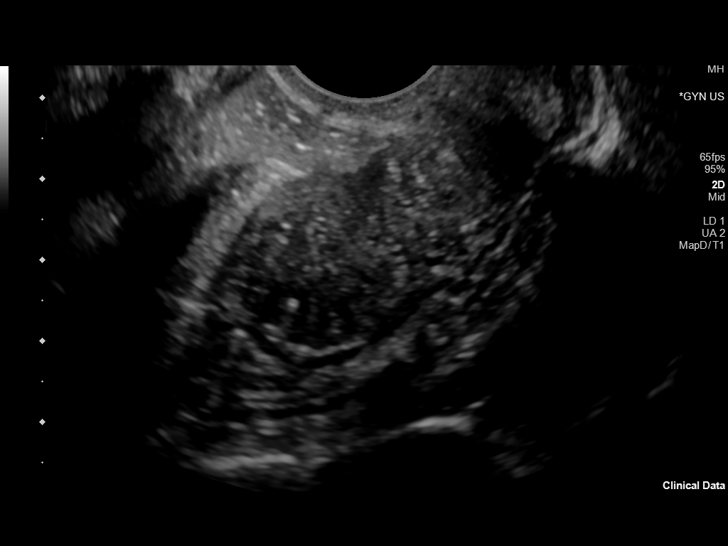
[im 56/96]
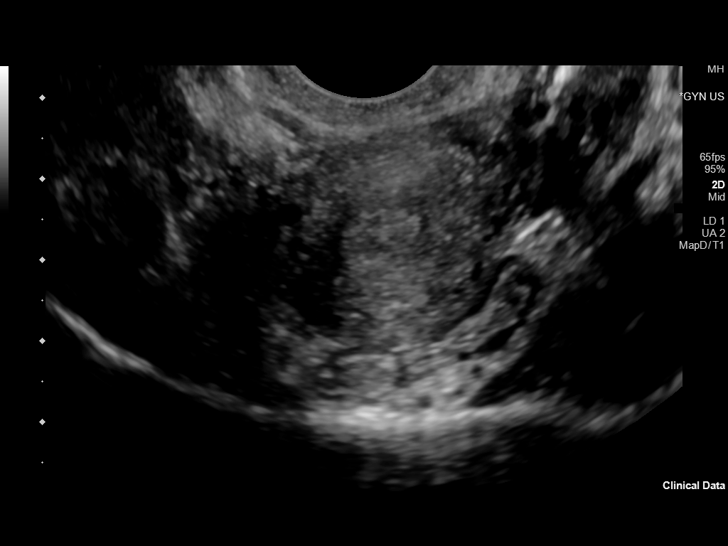
[im 64/96]
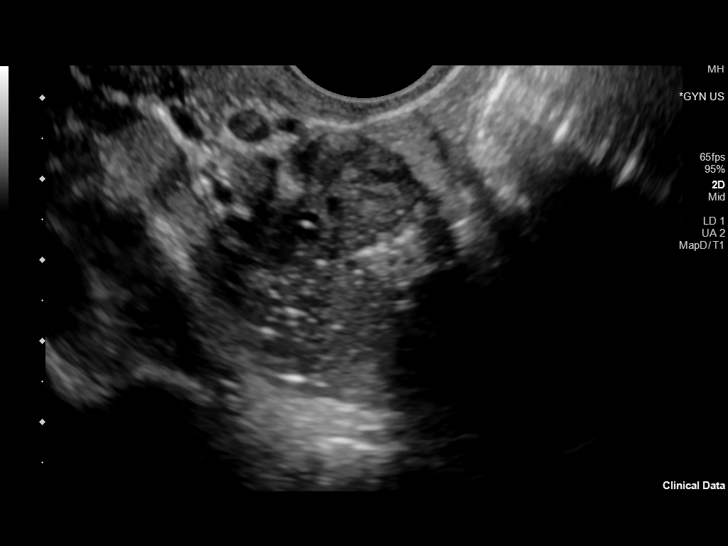
[im 72/96]
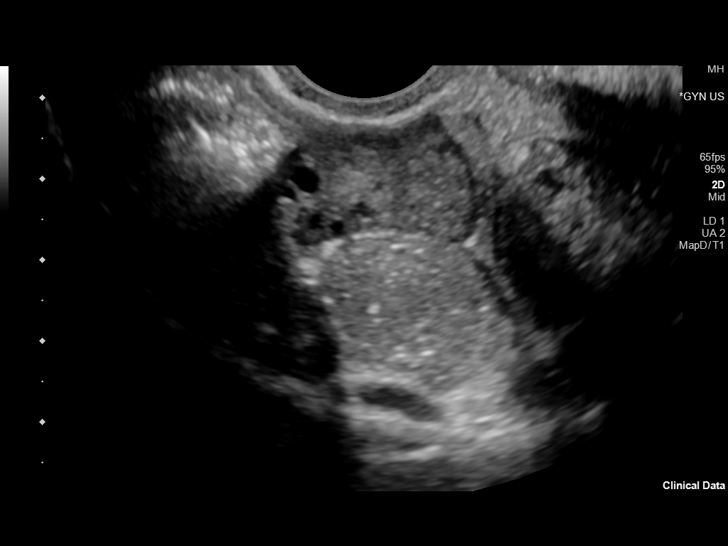
[im 80/96]
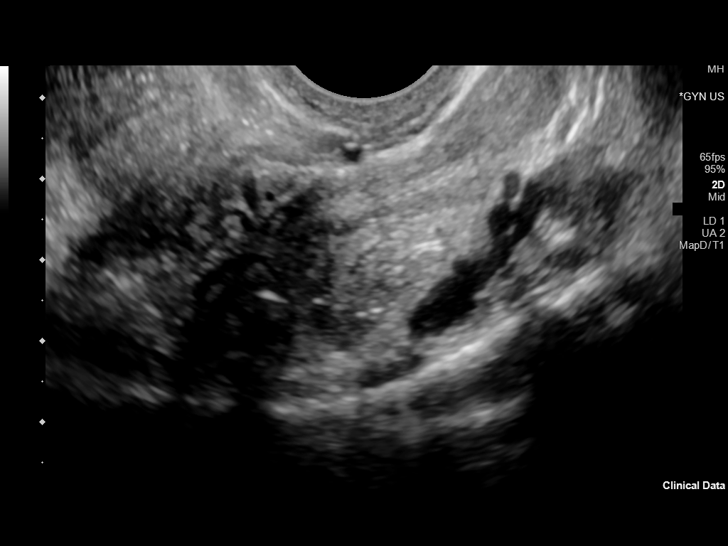
[im 88/96]
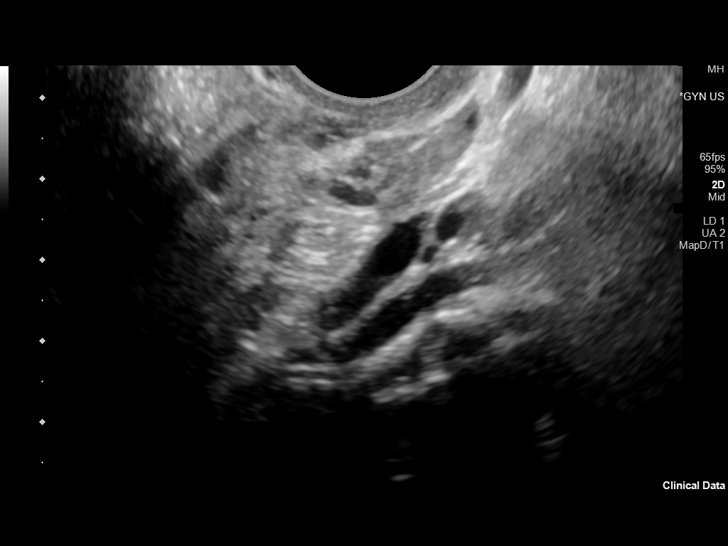
[im 96/96]
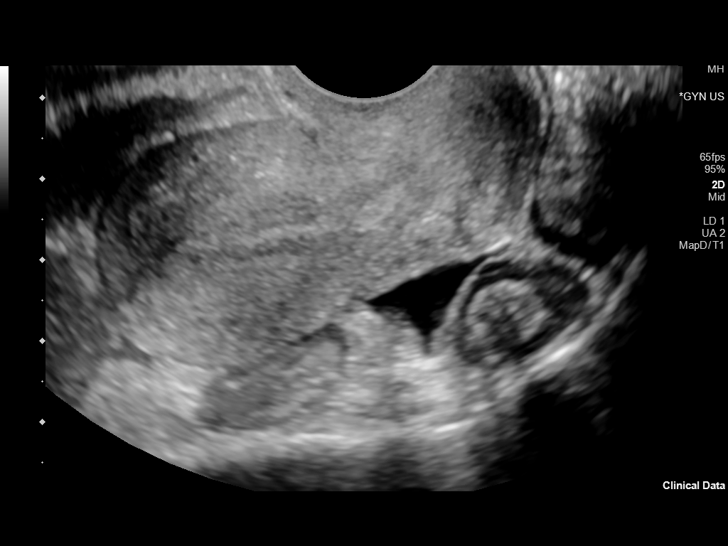

[13 of 25 positions shown; findings below may reference images not displayed]

FINDINGS: Uterus

Measurements: 5.9 x 2.9 x 3.2 cm = volume: 29 mL. No fibroids or
other mass visualized.

Endometrium

Thickness: 5.2 mm.  No focal abnormality visualized.

Right ovary

Measurements: 3.0 x 1.4 x 2.1 cm = volume: 4.7 mL. Normal
appearance/no adnexal mass.

Left ovary

Measurements: 2.7 x 1.3 x 1.4 cm = volume: 2.6 mL. Normal
appearance/no adnexal mass.

Other findings

Small volume free fluid within the pelvis, presumably physiologic.
IMPRESSION: 1. Endometrial stripe measures 5.2 mm in thickness. If bleeding
remains unresponsive to hormonal or medical therapy, sonohysterogram
should be considered for focal lesion work-up. (Ref: Radiological
Reasoning: Algorithmic Workup of Abnormal Vaginal Bleeding with
Endovaginal Sonography and Sonohysterography. [id number] [id number]; 191:S68-73).
2. Otherwise unremarkable and normal pelvic ultrasound. No other
acute abnormality.
